# Patient Record
Sex: Male | Born: 1935 | Race: Black or African American | Hispanic: No | Smoking: Never smoker
Health system: Southern US, Community
[De-identification: ages and names within clinical notes are randomized; demographics above are authoritative.]

## PROBLEM LIST (undated history)

## (undated) DIAGNOSIS — I255 Ischemic cardiomyopathy: Secondary | ICD-10-CM

## (undated) DIAGNOSIS — E785 Hyperlipidemia, unspecified: Secondary | ICD-10-CM

## (undated) DIAGNOSIS — I2510 Atherosclerotic heart disease of native coronary artery without angina pectoris: Secondary | ICD-10-CM

## (undated) DIAGNOSIS — I10 Essential (primary) hypertension: Secondary | ICD-10-CM

## (undated) DIAGNOSIS — N183 Chronic kidney disease, stage 3 unspecified: Secondary | ICD-10-CM

## (undated) HISTORY — DX: Hyperlipidemia, unspecified: E78.5

## (undated) HISTORY — PX: PROSTATE BIOPSY: SHX241

## (undated) HISTORY — DX: Ischemic cardiomyopathy: I25.5

## (undated) HISTORY — PX: INGUINAL HERNIA REPAIR: SUR1180

## (undated) HISTORY — DX: Chronic kidney disease, stage 3 unspecified: N18.30

## (undated) HISTORY — DX: Essential (primary) hypertension: I10

## (undated) HISTORY — DX: Atherosclerotic heart disease of native coronary artery without angina pectoris: I25.10

---

## 2009-12-20 ENCOUNTER — Ambulatory Visit: Payer: Self-pay | Admitting: Cardiology

## 2009-12-20 ENCOUNTER — Inpatient Hospital Stay (HOSPITAL_COMMUNITY): Admission: EM | Admit: 2009-12-20 | Discharge: 2009-12-25 | Payer: Self-pay | Admitting: Emergency Medicine

## 2009-12-21 ENCOUNTER — Encounter (INDEPENDENT_AMBULATORY_CARE_PROVIDER_SITE_OTHER): Payer: Self-pay | Admitting: Internal Medicine

## 2009-12-21 ENCOUNTER — Other Ambulatory Visit: Payer: Self-pay | Admitting: Urology

## 2009-12-21 ENCOUNTER — Other Ambulatory Visit: Payer: Self-pay | Admitting: Internal Medicine

## 2009-12-22 ENCOUNTER — Other Ambulatory Visit: Payer: Self-pay | Admitting: Urology

## 2009-12-22 ENCOUNTER — Other Ambulatory Visit: Payer: Self-pay | Admitting: Internal Medicine

## 2009-12-23 ENCOUNTER — Other Ambulatory Visit: Payer: Self-pay | Admitting: Internal Medicine

## 2009-12-23 ENCOUNTER — Other Ambulatory Visit: Payer: Self-pay | Admitting: Urology

## 2009-12-24 ENCOUNTER — Other Ambulatory Visit: Payer: Self-pay | Admitting: Family Medicine

## 2009-12-24 ENCOUNTER — Other Ambulatory Visit: Payer: Self-pay | Admitting: Internal Medicine

## 2009-12-25 ENCOUNTER — Other Ambulatory Visit: Payer: Self-pay | Admitting: Internal Medicine

## 2009-12-25 ENCOUNTER — Other Ambulatory Visit: Payer: Self-pay | Admitting: Family Medicine

## 2009-12-27 DIAGNOSIS — N400 Enlarged prostate without lower urinary tract symptoms: Secondary | ICD-10-CM | POA: Insufficient documentation

## 2009-12-27 DIAGNOSIS — E1151 Type 2 diabetes mellitus with diabetic peripheral angiopathy without gangrene: Secondary | ICD-10-CM | POA: Insufficient documentation

## 2009-12-27 DIAGNOSIS — I152 Hypertension secondary to endocrine disorders: Secondary | ICD-10-CM | POA: Insufficient documentation

## 2009-12-27 DIAGNOSIS — Z87898 Personal history of other specified conditions: Secondary | ICD-10-CM

## 2009-12-27 DIAGNOSIS — E119 Type 2 diabetes mellitus without complications: Secondary | ICD-10-CM | POA: Insufficient documentation

## 2009-12-27 DIAGNOSIS — D649 Anemia, unspecified: Secondary | ICD-10-CM | POA: Insufficient documentation

## 2009-12-27 DIAGNOSIS — I10 Essential (primary) hypertension: Secondary | ICD-10-CM

## 2009-12-27 DIAGNOSIS — D539 Nutritional anemia, unspecified: Secondary | ICD-10-CM

## 2009-12-29 ENCOUNTER — Inpatient Hospital Stay (HOSPITAL_COMMUNITY): Admission: EM | Admit: 2009-12-29 | Discharge: 2009-12-31 | Payer: Self-pay | Admitting: Emergency Medicine

## 2009-12-29 ENCOUNTER — Ambulatory Visit: Payer: Self-pay | Admitting: Internal Medicine

## 2009-12-30 ENCOUNTER — Encounter: Payer: Self-pay | Admitting: Internal Medicine

## 2009-12-31 ENCOUNTER — Encounter: Payer: Self-pay | Admitting: Internal Medicine

## 2010-01-03 ENCOUNTER — Telehealth: Payer: Self-pay | Admitting: Internal Medicine

## 2010-01-04 ENCOUNTER — Ambulatory Visit: Payer: Self-pay | Admitting: Internal Medicine

## 2010-01-15 ENCOUNTER — Encounter: Payer: Self-pay | Admitting: Family Medicine

## 2010-01-15 ENCOUNTER — Encounter: Payer: Self-pay | Admitting: Internal Medicine

## 2010-01-15 DIAGNOSIS — R0989 Other specified symptoms and signs involving the circulatory and respiratory systems: Secondary | ICD-10-CM | POA: Insufficient documentation

## 2010-01-15 DIAGNOSIS — R079 Chest pain, unspecified: Secondary | ICD-10-CM | POA: Insufficient documentation

## 2010-01-16 ENCOUNTER — Encounter: Payer: Self-pay | Admitting: Family Medicine

## 2010-01-16 LAB — CONVERTED CEMR LAB
Albumin: 3.8 g/dL (ref 3.5–5.2)
BUN: 26 mg/dL — ABNORMAL HIGH (ref 6–23)
CO2: 21 meq/L (ref 19–32)
Calcium: 9.5 mg/dL (ref 8.4–10.5)
Creatinine, Ser: 1.87 mg/dL — ABNORMAL HIGH (ref 0.40–1.50)
Ferritin: 337 ng/mL — ABNORMAL HIGH (ref 22–322)
Glucose, Bld: 95 mg/dL (ref 70–99)
MCHC: 32.6 g/dL (ref 30.0–36.0)
Platelets: 246 10*3/uL (ref 150–400)
Potassium: 5 meq/L (ref 3.5–5.3)
RBC: 3.5 M/uL — ABNORMAL LOW (ref 4.22–5.81)
RDW: 17.4 % — ABNORMAL HIGH (ref 11.5–15.5)
Sodium: 136 meq/L (ref 135–145)
WBC: 5.1 10*3/uL (ref 4.0–10.5)

## 2010-01-17 ENCOUNTER — Ambulatory Visit: Payer: Self-pay | Admitting: Internal Medicine

## 2010-01-17 DIAGNOSIS — I951 Orthostatic hypotension: Secondary | ICD-10-CM | POA: Insufficient documentation

## 2010-01-29 ENCOUNTER — Encounter: Payer: Self-pay | Admitting: Internal Medicine

## 2010-02-20 ENCOUNTER — Ambulatory Visit: Payer: Self-pay | Admitting: Internal Medicine

## 2010-02-26 ENCOUNTER — Encounter: Payer: Self-pay | Admitting: Internal Medicine

## 2010-02-28 LAB — CONVERTED CEMR LAB
Calcium: 9.2 mg/dL (ref 8.4–10.5)
Glucose, Bld: 93 mg/dL (ref 70–99)
Sodium: 138 meq/L (ref 135–145)

## 2010-11-05 NOTE — Procedures (Signed)
Summary: Holter and Event  Holter and Event   Imported By: Erle Crocker 03/01/2010 09:03:36  _____________________________________________________________________  External Attachment:    Type:   Image     Comment:   External Document

## 2010-11-05 NOTE — Progress Notes (Signed)
Summary: needs a friday if possible  Phone Note Call from Patient Call back at [phone]    Caller: pt Reason for Call: Talk to Doctor Summary of Call: pt is the father of dr simpson and she is calling to see if we could possible move him to a friday after 12:00 she doesnt work on [employer] after noon. I offer a appt in may but she needs him seen in the next couple weeks. Initial call taken by: Faythe Ghee,  January 03, 2010 12:28 PM  Follow-up for Phone Call        Fri 5/6 or they can keep appt already sch on 4/14 Meredith Staggers, RN  January 11, 2010 1:27 PM   pt will keep appt 4/14 Meredith Staggers, RN  January 14, 2010 9:30 AM

## 2010-11-05 NOTE — Assessment & Plan Note (Signed)
Summary: EPH/APPT AT 1:30   Visit Type:  Initial Consult Primary Provider:  Dr Mirna Mires  CC:  some lightheaded, low BP, and palpatations.  History of Present Illness: Roy Ibarra is a delightful 77 y/o male (father of Roy Ibarra) with a h/o HTN, hyperlipidemia and recently-diagnosed diabetes. Presents for post-hospital f/u.   Recently underwent prostate biopsy and was comlicated by prostate abscess. During this time developed CP, dyspnea and palpitations. EF 55-65% mild LVH RV normal. No vegetations. Myoview with EF 62% and no ischemia or scar. Has beend wearing event monitor.   No CP, SOB or palpitations. Main problem is orthostasis. Daughter stopped the avodart and is holding Diovan. Perhaps mildly better but still having problems. No syncope. No edema. Po intake ok. However Cr up from 1.1 to 1.8 recently. No dysuria. No fevers and chills.   Current Medications (verified): 1)  Diovan 80 Mg Tabs (Valsartan) .... Take 1 Tab Daily -- Hold 2)  Metformin Hcl 500 Mg Tabs (Metformin Hcl) .... Take 1 Tab Daily -- Hold 3)  Ferrous Sulfate 325 (65 Fe) Mg Tabs (Ferrous Sulfate) .... Take 1 Tab Daily 4)  Calcium 600 1500 Mg Tabs (Calcium Carbonate) .... Take 1 Tab Daily 5)  Daily Multi  Tabs (Multiple Vitamins-Minerals) .... Take 1 Tab Daily 6)  Septra Ds 800-160 Mg Tabs (Sulfamethoxazole-Trimethoprim) .... Two Times A Day 7)  Cosopt 22.3-6.8 Mg/ml Soln (Dorzolamide Hcl-Timolol Mal) .... Use Daily 8)  Hyoscyamine Sulfate 0.125 Mg Tabs (Hyoscyamine Sulfate) .... Every 6 Hrs As Needed  Allergies: No Known Drug Allergies  Past History:  Past Medical History: Last updated: 01/15/2010  1. Chest pain.   2. Anemia.   3. Type 2 diabetes.   4. Prostate abscess currently on Bactrim.   5. Generalized weakness.   6. Hypertension.   7. Benign prostatic hypertrophy.   8. Thrombocytosis.   9. Tachycardia.  Family History: Family History of Cancer:  Family History of Diabetes:    Social History: Retired  Married -- daughter is a family Dr in [employer] Tobacco Use - No.  Alcohol Use - no Regular Exercise - no Drug Use - no  Vital Signs:  Patient profile:   77 year old male Height:      71 inches Weight:      174 pounds O2 Sat:      97 % on Room air Pulse rate:   68 / minute Pulse (ortho):   90 / minute BP standing:   98 / 62  Vitals Entered By: Hardin Negus, RMA (January 17, 2010 3:09 PM)  O2 Flow:  Room air  Serial Vital Signs/Assessments:  Time      Position  BP       Pulse  Resp  Temp     By           Lying LA  138/70   65                    Hardin Negus, RMA           Sitting   112/70   [address], [place]           Standing  98/62    90                    Hardin Negus, RMA  Comments: 2 mins -  BP 112/70  P 89 4 mins - BP 122/74  P 85 very lightheaded when he stood up then resolved fairly quick By: Hardin Negus, RMA    Physical Exam  General:  Gen: thin well appearing. no resp difficulty HEENT: normal Neck: supple. no JVD. Carotids 2+ bilat; no bruits.  Cor: PMI nondisplaced. Regular rate & rhythm. No rubs murmur. +s4 Lungs: clear Abdomen: soft, nontender, nondistended. No hepatosplenomegaly. No bruits or masses. Good bowel sounds. Extremities: no cyanosis, clubbing, rash, edema Neuro: alert & orientedx3, cranial nerves grossly intact. moves all 4 extremities w/o difficulty. affect pleasant    Impression & Recommendations:  Problem # 1:  ORTHOSTATIC HYPOTENSION (ICD-458.0) Improving slowly. Hold all BP meds. Increase fluid and salt intake. Support stockings. If not improving can consider Florinef. PCP will recheck BMET if creatinine continue to increase will need renal u/s to r/o obstruction.  Problem # 2:  TACHYCARDIA (ICD-785) Resolved. Monitor looks good so far. ECHO and stress test normal.   Other Orders: EKG w/ Interpretation (93000)  Patient Instructions: 1)  Follow up in 1  month  Prevention & Chronic Care Immunizations   Influenza vaccine: Not documented    Tetanus booster: Not documented    Pneumococcal vaccine: Not documented    H. zoster vaccine: Not documented  Colorectal Screening   Hemoccult: Not documented    Colonoscopy: Not documented  Other Screening   PSA: Not documented   Smoking status: never  (12/27/2009)  Diabetes Mellitus   HgbA1C: 7.6  (01/15/2010)    Eye exam: Not documented    Foot exam: Not documented   High risk foot: Not documented   Foot care education: Not documented    Urine microalbumin/creatinine ratio: Not documented  Lipids   Total Cholesterol: Not documented   LDL: Not documented   LDL Direct: Not documented   HDL: Not documented   Triglycerides: Not documented  Hypertension   Last Blood Pressure: Not Documented   Serum creatinine: 1.87  (01/15/2010)   Serum potassium 5.0  (01/15/2010)  Self-Management Support :    Diabetes self-management support: Not documented    Hypertension self-management support: Not documented

## 2010-11-05 NOTE — Assessment & Plan Note (Signed)
Summary: per check out/sf   Visit Type:  Follow-up Primary Provider:  Dr Mirna Mires  CC:  no complaints.  History of Present Illness: Roy Ibarra is a delightful 77 y/o male (father of Dr. Syliva Ibarra) with a h/o HTN, hyperlipidemia and recently-diagnosed diabetes. Presents for post-hospital f/u.   Recently underwent prostate biopsy and was comlicated by prostate abscess. During this time developed CP, dyspnea and palpitations. EF 55-65% mild LVH RV normal. No vegetations. Myoview with EF 62% and no ischemia or scar. Monitor with SR with occ PACs.  No CP, SOB or palpitations. Trace edema Walking regularly. BP up a bit. Previosuly on Diovan 160 but cut back.   Current Medications (verified): 1)  Diovan 80 Mg Tabs (Valsartan) .... Take 1 Tab Daily 2)  Ferrous Sulfate 325 (65 Fe) Mg Tabs (Ferrous Sulfate) .... Take 1 Tab Daily 3)  Calcium 600 1500 Mg Tabs (Calcium Carbonate) .... Take 1 Tab Daily 4)  Daily Multi  Tabs (Multiple Vitamins-Minerals) .... Take 1 Tab Daily 5)  Cosopt 22.3-6.8 Mg/ml Soln (Dorzolamide Hcl-Timolol Mal) .... Use Daily  Allergies (verified): No Known Drug Allergies  Past History:  Past Medical History: Last updated: 01/15/2010  1. Chest pain.   2. Anemia.   3. Type 2 diabetes.   4. Prostate abscess currently on Bactrim.   5. Generalized weakness.   6. Hypertension.   7. Benign prostatic hypertrophy.   8. Thrombocytosis.   9. Tachycardia.  Review of Systems       As per HPI and past medical history; otherwise all systems negative.   Vital Signs:  Patient profile:   77 year old male Height:      71 inches Weight:      174 pounds BMI:     24.36 Pulse rate:   65 / minute BP sitting:   148 / 92  (left arm) Cuff size:   regular  Vitals Entered By: Hardin Negus, RMA (Feb 20, 2010 3:21 PM)  Physical Exam  General:  Gen: thin well appearing. no resp difficulty HEENT: normal Neck: supple. no JVD. Carotids 2+ bilat; no bruits.  Cor: PMI  nondisplaced. Regular rate & rhythm. No rubs murmur. +s4 Lungs: clear Abdomen: soft, nontender, nondistended. No hepatosplenomegaly. No bruits or masses. Good bowel sounds. Extremities: no cyanosis, clubbing, rash. tr edema Neuro: alert & orientedx3, cranial nerves grossly intact. moves all 4 extremities w/o difficulty. affect pleasant    Impression & Recommendations:  Problem # 1:  HYPERTENSION (ICD-401.9) Orthostasis resolved. BP mildly elevated. Will increase Diovan to 160. Recheck BMET and BNP in 1 week prior to his return to [place].   Patient Instructions: 1)  Increase Diovan to 160mg  daily 2)  Labs with PCP 3)  Follow up as needed

## 2010-12-30 LAB — GLUCOSE, CAPILLARY
Glucose-Capillary: 103 mg/dL — ABNORMAL HIGH (ref 70–99)
Glucose-Capillary: 117 mg/dL — ABNORMAL HIGH (ref 70–99)
Glucose-Capillary: 122 mg/dL — ABNORMAL HIGH (ref 70–99)
Glucose-Capillary: 125 mg/dL — ABNORMAL HIGH (ref 70–99)
Glucose-Capillary: 132 mg/dL — ABNORMAL HIGH (ref 70–99)
Glucose-Capillary: 139 mg/dL — ABNORMAL HIGH (ref 70–99)
Glucose-Capillary: 148 mg/dL — ABNORMAL HIGH (ref 70–99)
Glucose-Capillary: 155 mg/dL — ABNORMAL HIGH (ref 70–99)
Glucose-Capillary: 158 mg/dL — ABNORMAL HIGH (ref 70–99)
Glucose-Capillary: 164 mg/dL — ABNORMAL HIGH (ref 70–99)
Glucose-Capillary: 170 mg/dL — ABNORMAL HIGH (ref 70–99)
Glucose-Capillary: 188 mg/dL — ABNORMAL HIGH (ref 70–99)
Glucose-Capillary: 206 mg/dL — ABNORMAL HIGH (ref 70–99)
Glucose-Capillary: 86 mg/dL (ref 70–99)
Glucose-Capillary: 98 mg/dL (ref 70–99)

## 2010-12-30 LAB — CBC
HCT: 26 % — ABNORMAL LOW (ref 39.0–52.0)
HCT: 28.1 % — ABNORMAL LOW (ref 39.0–52.0)
HCT: 29.2 % — ABNORMAL LOW (ref 39.0–52.0)
HCT: 31.2 % — ABNORMAL LOW (ref 39.0–52.0)
HCT: 31.4 % — ABNORMAL LOW (ref 39.0–52.0)
Hemoglobin: 10.4 g/dL — ABNORMAL LOW (ref 13.0–17.0)
Hemoglobin: 10.9 g/dL — ABNORMAL LOW (ref 13.0–17.0)
Hemoglobin: 8.7 g/dL — ABNORMAL LOW (ref 13.0–17.0)
Hemoglobin: 8.7 g/dL — ABNORMAL LOW (ref 13.0–17.0)
Hemoglobin: 9.2 g/dL — ABNORMAL LOW (ref 13.0–17.0)
Hemoglobin: 9.9 g/dL — ABNORMAL LOW (ref 13.0–17.0)
MCHC: 32.6 g/dL (ref 30.0–36.0)
MCHC: 33.4 g/dL (ref 30.0–36.0)
MCHC: 33.5 g/dL (ref 30.0–36.0)
MCHC: 33.6 g/dL (ref 30.0–36.0)
MCV: 83.8 fL (ref 78.0–100.0)
MCV: 84.7 fL (ref 78.0–100.0)
MCV: 84.7 fL (ref 78.0–100.0)
MCV: 85 fL (ref 78.0–100.0)
MCV: 85.1 fL (ref 78.0–100.0)
MCV: 85.5 fL (ref 78.0–100.0)
Platelets: 402 10*3/uL — ABNORMAL HIGH (ref 150–400)
Platelets: 416 10*3/uL — ABNORMAL HIGH (ref 150–400)
Platelets: 447 10*3/uL — ABNORMAL HIGH (ref 150–400)
Platelets: 475 10*3/uL — ABNORMAL HIGH (ref 150–400)
RBC: 3.12 MIL/uL — ABNORMAL LOW (ref 4.22–5.81)
RBC: 3.26 MIL/uL — ABNORMAL LOW (ref 4.22–5.81)
RBC: 3.31 MIL/uL — ABNORMAL LOW (ref 4.22–5.81)
RBC: 3.5 MIL/uL — ABNORMAL LOW (ref 4.22–5.81)
RBC: 3.68 MIL/uL — ABNORMAL LOW (ref 4.22–5.81)
RBC: 3.74 MIL/uL — ABNORMAL LOW (ref 4.22–5.81)
RDW: 13.8 % (ref 11.5–15.5)
RDW: 14.1 % (ref 11.5–15.5)
RDW: 14.3 % (ref 11.5–15.5)
RDW: 14.5 % (ref 11.5–15.5)
RDW: 14.9 % (ref 11.5–15.5)
RDW: 14.9 % (ref 11.5–15.5)
WBC: 21.6 10*3/uL — ABNORMAL HIGH (ref 4.0–10.5)
WBC: 21.9 10*3/uL — ABNORMAL HIGH (ref 4.0–10.5)
WBC: 6.4 10*3/uL (ref 4.0–10.5)

## 2010-12-30 LAB — BASIC METABOLIC PANEL
BUN: 11 mg/dL (ref 6–23)
BUN: 13 mg/dL (ref 6–23)
BUN: 14 mg/dL (ref 6–23)
CO2: 24 mEq/L (ref 19–32)
CO2: 25 mEq/L (ref 19–32)
CO2: 26 mEq/L (ref 19–32)
Calcium: 8.1 mg/dL — ABNORMAL LOW (ref 8.4–10.5)
Calcium: 8.7 mg/dL (ref 8.4–10.5)
Calcium: 8.9 mg/dL (ref 8.4–10.5)
Chloride: 103 mEq/L (ref 96–112)
Chloride: 105 mEq/L (ref 96–112)
Chloride: 105 mEq/L (ref 96–112)
Creatinine, Ser: 1.13 mg/dL (ref 0.4–1.5)
Creatinine, Ser: 1.13 mg/dL (ref 0.4–1.5)
Creatinine, Ser: 1.14 mg/dL (ref 0.4–1.5)
GFR calc Af Amer: >60 mL/min (ref >60)
GFR calc non Af Amer: >60 mL/min (ref >60)
GFR calc non Af Amer: >60 mL/min (ref >60)
Glucose, Bld: 110 mg/dL — ABNORMAL HIGH (ref 70–99)
Glucose, Bld: 127 mg/dL — ABNORMAL HIGH (ref 70–99)
Glucose, Bld: 136 mg/dL — ABNORMAL HIGH (ref 70–99)
Glucose, Bld: 98 mg/dL (ref 70–99)
Potassium: 4.1 mEq/L (ref 3.5–5.1)
Sodium: 129 mEq/L — ABNORMAL LOW (ref 135–145)
Sodium: 134 mEq/L — ABNORMAL LOW (ref 135–145)
Sodium: 137 mEq/L (ref 135–145)

## 2010-12-30 LAB — COMPREHENSIVE METABOLIC PANEL
ALT: 25 U/L (ref 0–53)
ALT: 31 U/L (ref 0–53)
AST: 25 U/L (ref 0–37)
Albumin: 2.5 g/dL — ABNORMAL LOW (ref 3.5–5.2)
Alkaline Phosphatase: 86 U/L (ref 39–117)
BUN: 10 mg/dL (ref 6–23)
CO2: 22 mEq/L (ref 19–32)
CO2: 23 mEq/L (ref 19–32)
CO2: 25 mEq/L (ref 19–32)
Calcium: 8.1 mg/dL — ABNORMAL LOW (ref 8.4–10.5)
Calcium: 8.9 mg/dL (ref 8.4–10.5)
Chloride: 104 mEq/L (ref 96–112)
Chloride: 98 mEq/L (ref 96–112)
Creatinine, Ser: 0.88 mg/dL (ref 0.4–1.5)
Creatinine, Ser: 1.81 mg/dL — ABNORMAL HIGH (ref 0.4–1.5)
GFR calc Af Amer: >60 mL/min (ref >60)
GFR calc Af Amer: >60 mL/min (ref >60)
GFR calc non Af Amer: 36 mL/min — ABNORMAL LOW (ref >60)
GFR calc non Af Amer: 52 mL/min — ABNORMAL LOW (ref >60)
GFR calc non Af Amer: >60 mL/min (ref >60)
Glucose, Bld: 136 mg/dL — ABNORMAL HIGH (ref 70–99)
Glucose, Bld: 143 mg/dL — ABNORMAL HIGH (ref 70–99)
Sodium: 131 mEq/L — ABNORMAL LOW (ref 135–145)
Total Bilirubin: 0.4 mg/dL (ref 0.3–1.2)
Total Bilirubin: 0.6 mg/dL (ref 0.3–1.2)
Total Protein: 8.7 g/dL — ABNORMAL HIGH (ref 6.0–8.3)

## 2010-12-30 LAB — CULTURE, BLOOD (ROUTINE X 2)
Culture: NO GROWTH
Culture: NO GROWTH

## 2010-12-30 LAB — DIFFERENTIAL
Basophils Absolute: 0 10*3/uL (ref 0.0–0.1)
Basophils Absolute: 0 10*3/uL (ref 0.0–0.1)
Basophils Absolute: 0 10*3/uL (ref 0.0–0.1)
Eosinophils Absolute: 0 10*3/uL (ref 0.0–0.7)
Eosinophils Absolute: 0.1 10*3/uL (ref 0.0–0.7)
Eosinophils Absolute: 0.2 10*3/uL (ref 0.0–0.7)
Eosinophils Relative: 0 % (ref 0–5)
Eosinophils Relative: 2 % (ref 0–5)
Eosinophils Relative: 3 % (ref 0–5)
Lymphocytes Relative: 31 % (ref 12–46)
Lymphocytes Relative: 4 % — ABNORMAL LOW (ref 12–46)
Lymphs Abs: 1.4 10*3/uL (ref 0.7–4.0)
Lymphs Abs: 2 10*3/uL (ref 0.7–4.0)
Lymphs Abs: 4.8 10*3/uL — ABNORMAL HIGH (ref 0.7–4.0)
Monocytes Absolute: 1.9 10*3/uL — ABNORMAL HIGH (ref 0.1–1.0)
Monocytes Absolute: 3.5 10*3/uL — ABNORMAL HIGH (ref 0.1–1.0)
Monocytes Relative: 13 % — ABNORMAL HIGH (ref 3–12)
Monocytes Relative: 14 % — ABNORMAL HIGH (ref 3–12)
Monocytes Relative: 9 % (ref 3–12)
Myelocytes: 0 %
Neutro Abs: 18.9 10*3/uL — ABNORMAL HIGH (ref 1.7–7.7)
nRBC: 0 /100 WBC

## 2010-12-30 LAB — CK TOTAL AND CKMB (NOT AT ARMC)
CK, MB: 3 ng/mL (ref 0.3–4.0)
Relative Index: INVALID (ref 0.0–2.5)
Total CK: 57 U/L (ref 7–232)

## 2010-12-30 LAB — URINALYSIS, ROUTINE W REFLEX MICROSCOPIC
Bilirubin Urine: NEGATIVE
Glucose, UA: NEGATIVE mg/dL
Hgb urine dipstick: NEGATIVE
Ketones, ur: NEGATIVE mg/dL
Nitrite: NEGATIVE
Protein, ur: NEGATIVE mg/dL
Specific Gravity, Urine: 1.021 (ref 1.005–1.030)
Urobilinogen, UA: 1 mg/dL (ref 0.0–1.0)

## 2010-12-30 LAB — CARDIAC PANEL(CRET KIN+CKTOT+MB+TROPI)
Relative Index: INVALID (ref 0.0–2.5)
Relative Index: INVALID (ref 0.0–2.5)
Relative Index: INVALID (ref 0.0–2.5)
Relative Index: INVALID (ref 0.0–2.5)
Total CK: 51 U/L (ref 7–232)
Total CK: 81 U/L (ref 7–232)
Troponin I: 0.02 ng/mL (ref 0.00–0.06)
Troponin I: 0.04 ng/mL (ref 0.00–0.06)
Troponin I: 0.04 ng/mL (ref 0.00–0.06)
Troponin I: 0.05 ng/mL (ref 0.00–0.06)
Troponin I: 0.06 ng/mL (ref 0.00–0.06)

## 2010-12-30 LAB — RETICULOCYTES
RBC.: 3.75 MIL/uL — ABNORMAL LOW (ref 4.22–5.81)
Retic Count, Absolute: 37.5 10*3/uL (ref 19.0–186.0)

## 2010-12-30 LAB — D-DIMER, QUANTITATIVE: D-Dimer, Quant: 1.62 ug/mL-FEU — ABNORMAL HIGH (ref 0.00–0.48)

## 2010-12-30 LAB — TSH: TSH: 1.508 u[IU]/mL (ref 0.350–4.500)

## 2010-12-30 LAB — URINE MICROSCOPIC-ADD ON

## 2010-12-30 LAB — POCT I-STAT, CHEM 8
BUN: 20 mg/dL (ref 6–23)
Creatinine, Ser: 1.4 mg/dL (ref 0.4–1.5)
Glucose, Bld: 157 mg/dL — ABNORMAL HIGH (ref 70–99)
Hemoglobin: 10.5 g/dL — ABNORMAL LOW (ref 13.0–17.0)
Potassium: 4.5 mEq/L (ref 3.5–5.1)

## 2010-12-30 LAB — MRSA CULTURE

## 2010-12-30 LAB — URINE CULTURE
Culture: NO GROWTH
Special Requests: POSITIVE

## 2010-12-30 LAB — TROPONIN I: Troponin I: 0.07 ng/mL — ABNORMAL HIGH (ref 0.00–0.06)

## 2010-12-30 LAB — T4: T4, Total: 5.9 ug/dL (ref 5.0–12.5)

## 2010-12-30 LAB — TYPE AND SCREEN: ABO/RH(D): A NEG

## 2010-12-30 LAB — ABO/RH: ABO/RH(D): A NEG

## 2010-12-30 LAB — VITAMIN B12: Vitamin B-12: 780 pg/mL (ref 211–911)

## 2010-12-30 LAB — POCT CARDIAC MARKERS: CKMB, poc: 2.3 ng/mL (ref 1.0–8.0)

## 2010-12-30 LAB — IRON AND TIBC
TIBC: 155 ug/dL — ABNORMAL LOW (ref 215–435)
UIBC: 136 ug/dL

## 2010-12-30 LAB — HEMOCCULT GUIAC POC 1CARD (OFFICE): Fecal Occult Bld: NEGATIVE

## 2010-12-30 LAB — OSMOLALITY: Osmolality: 273 mOsm/kg — ABNORMAL LOW (ref 275–300)

## 2010-12-30 LAB — OSMOLALITY, URINE: Osmolality, Ur: 544 mOsm/kg (ref 390–1090)

## 2010-12-30 LAB — FERRITIN: Ferritin: 460 ng/mL — ABNORMAL HIGH (ref 22–322)

## 2011-12-23 ENCOUNTER — Other Ambulatory Visit: Payer: Self-pay | Admitting: Ophthalmology

## 2011-12-23 DIAGNOSIS — H2510 Age-related nuclear cataract, unspecified eye: Secondary | ICD-10-CM

## 2011-12-23 IMAGING — CR DG CHEST 1V PORT
1 series · 1 of 1 positions shown · non-contrast
Comparison: 12/20/2009

CLINICAL DATA: Fast heart rate.  Chest pain.

PORTABLE CHEST - 1 VIEW

[view not recorded]
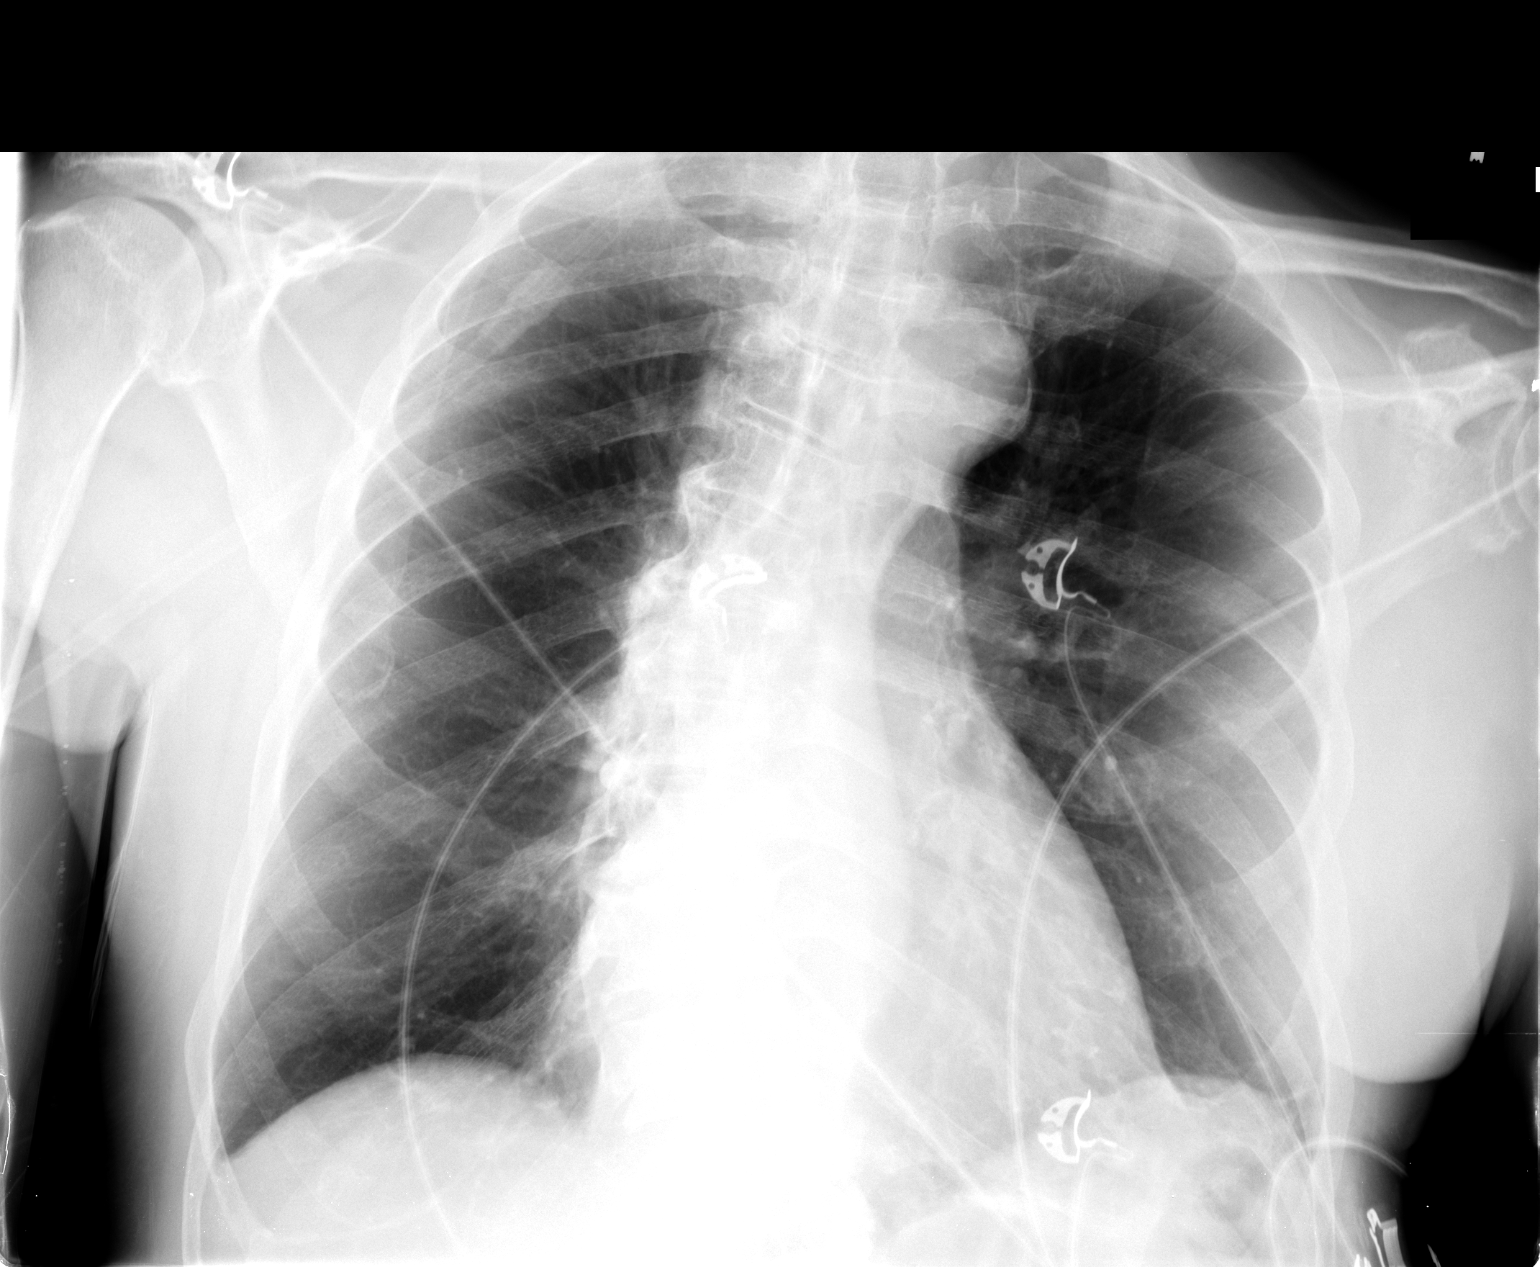

[1 of 1 positions shown; findings below may reference images not displayed]

FINDINGS: Heart size is normal.

No pleural effusion or pulmonary edema.

No airspace consolidation identified.
IMPRESSION: 1.  No active cardiopulmonary disease.

## 2011-12-31 ENCOUNTER — Encounter (HOSPITAL_COMMUNITY): Admission: RE | Payer: Self-pay | Source: Ambulatory Visit

## 2011-12-31 ENCOUNTER — Ambulatory Visit (HOSPITAL_COMMUNITY): Admission: RE | Admit: 2011-12-31 | Payer: Self-pay | Source: Ambulatory Visit | Admitting: Ophthalmology

## 2011-12-31 SURGERY — PHACOEMULSIFICATION, CATARACT, WITH IOL INSERTION
Anesthesia: Monitor Anesthesia Care | Laterality: Left

## 2016-08-15 ENCOUNTER — Ambulatory Visit (INDEPENDENT_AMBULATORY_CARE_PROVIDER_SITE_OTHER): Payer: Self-pay | Admitting: Family Medicine

## 2016-08-15 ENCOUNTER — Encounter: Payer: Self-pay | Admitting: Family Medicine

## 2016-08-15 VITALS — BP 136/94 | Ht 71.0 in | Wt 179.0 lb

## 2016-08-15 DIAGNOSIS — I10 Essential (primary) hypertension: Secondary | ICD-10-CM

## 2016-08-15 MED ORDER — VALSARTAN 80 MG PO TABS: 80.0000 mg | ORAL_TABLET | Freq: Every day | ORAL | 1 refills | Status: DC

## 2016-08-15 NOTE — Progress Notes (Signed)
   Subjective:    Patient ID: Roy Ibarra, male    DOB: 09/27/1923, 82 y.o.   MRN: [id number]  HPI Patient in today for consult as new patient.  Only on bp meds, geernally good control on the   Vit d supplement  Takes regulaly   bp meds good   Eat well and goo  flomas did not help much    ytwo or three yrs ago  Developed sepsis from prostate   Was on flomax for urinating, but light headed caused troubles  [facility]   [facility]   Patient has concerns of frequent urination at night.    Review of Systems No headache, no major weight loss or weight gain, no chest pain no back pain abdominal pain no change in bowel habits complete ROS otherwise negative     Objective:   Physical Exam  Alert vitals stable, NAD. Blood pressure good on repeat. HEENT normal. Lungs clear. Heart regular rate and rhythm.       Assessment & Plan:  Impression hypertension good control discussed maintain same #2 history of prostate abscess with subsequent sepsis which nearly tick patient's life #3 probable prostate hypertrophy with minimal responsive Flomax plan diet exercise discussed. Maintain blood pressure medication. In 6 months will consider physical plus blood work patient is said blood work a few months ago [place]

## 2017-05-11 ENCOUNTER — Other Ambulatory Visit: Payer: Self-pay | Admitting: Neurology

## 2017-05-11 DIAGNOSIS — G459 Transient cerebral ischemic attack, unspecified: Secondary | ICD-10-CM

## 2017-05-12 ENCOUNTER — Ambulatory Visit (HOSPITAL_COMMUNITY)
Admission: RE | Admit: 2017-05-12 | Discharge: 2017-05-12 | Disposition: A | Discharge status: Home / Self Care | Payer: Self-pay | Source: Ambulatory Visit | Attending: Neurology | Admitting: Neurology

## 2017-05-12 DIAGNOSIS — G459 Transient cerebral ischemic attack, unspecified: Secondary | ICD-10-CM | POA: Insufficient documentation

## 2017-05-25 ENCOUNTER — Other Ambulatory Visit: Payer: Self-pay | Admitting: Neurology

## 2017-05-25 DIAGNOSIS — R531 Weakness: Secondary | ICD-10-CM

## 2017-05-25 DIAGNOSIS — R471 Dysarthria and anarthria: Secondary | ICD-10-CM

## 2017-06-01 ENCOUNTER — Ambulatory Visit (HOSPITAL_COMMUNITY)
Admission: RE | Admit: 2017-06-01 | Discharge: 2017-06-01 | Disposition: A | Discharge status: Home / Self Care | Payer: Self-pay | Source: Ambulatory Visit | Attending: Neurology | Admitting: Neurology

## 2017-06-01 DIAGNOSIS — R471 Dysarthria and anarthria: Secondary | ICD-10-CM | POA: Insufficient documentation

## 2017-06-01 DIAGNOSIS — R531 Weakness: Secondary | ICD-10-CM | POA: Insufficient documentation

## 2017-06-01 DIAGNOSIS — J322 Chronic ethmoidal sinusitis: Secondary | ICD-10-CM | POA: Insufficient documentation

## 2017-11-06 HISTORY — PX: STENT PLACEMENT VASCULAR (ARMC HX): HXRAD1737

## 2018-08-20 ENCOUNTER — Ambulatory Visit (INDEPENDENT_AMBULATORY_CARE_PROVIDER_SITE_OTHER): Payer: Self-pay | Admitting: Cardiovascular Disease

## 2018-08-20 ENCOUNTER — Encounter: Payer: Self-pay | Admitting: Cardiovascular Disease

## 2018-08-20 VITALS — BP 164/78 | HR 69 | Ht 71.0 in | Wt 181.0 lb

## 2018-08-20 DIAGNOSIS — Z955 Presence of coronary angioplasty implant and graft: Secondary | ICD-10-CM

## 2018-08-20 DIAGNOSIS — E785 Hyperlipidemia, unspecified: Secondary | ICD-10-CM

## 2018-08-20 DIAGNOSIS — I10 Essential (primary) hypertension: Secondary | ICD-10-CM

## 2018-08-20 DIAGNOSIS — I25118 Atherosclerotic heart disease of native coronary artery with other forms of angina pectoris: Secondary | ICD-10-CM

## 2018-08-20 MED ORDER — VALSARTAN 80 MG PO TABS: 80.0000 mg | ORAL_TABLET | Freq: Every day | ORAL | 3 refills | Status: DC

## 2018-08-20 MED ORDER — ATORVASTATIN CALCIUM 40 MG PO TABS: 40.0000 mg | ORAL_TABLET | Freq: Every day | ORAL | 3 refills | Status: DC

## 2018-08-20 NOTE — Progress Notes (Addendum)
CARDIOLOGY CONSULT NOTE  Patient ID: Roy Ibarra MRN: [id number] DOB/AGE: [birth year] 82 y.o.  Admit date: (Not on file) Primary Physician: Merlyn Albert, MD Referring Physician: Dr. [name]  Reason for Consultation: Coronary artery disease  HPI: Roy Ibarra is a 84 y.o. male who is being seen today for the evaluation of coronary artery disease at the request of Dr. [name].  He underwent a normal nuclear stress test on 12/30/2009, LVEF 62%.  He underwent normal carotid Dopplers on 05/12/2017.  He is a very pleasant 84 year old gentleman who is here with his daughter today, Dr. [name].  He has been in the [place] for about a month.  He had been living in [place] which is where he grew up.  He was born on [address] of [place] and then moved to [place].  He underwent cardiac catheterization in February 2019 and reportedly underwent stent placement to the LAD.  Dr. Lodema Hong said she will obtain the cardiac catheterization report and provide me with this.  He is generally feeling well.  He denies exertional chest pain and shortness of breath.  He denies palpitations, leg swelling, orthopnea, dizziness, and paroxysmal nocturnal dyspnea.  He has been following with the Chief of Cardiology at the [facility] in [place], [place].  Other relevant past medical history includes prostate abscess after a biopsy about 7 years ago.  He also had an inguinal hernia repair.  Dr. Lodema Hong tells me that he has had hypertension for at least 40 years but he has been doing well with this.  He is currently taking valsartan 80 mg daily.  He took a walk outside yesterday and enjoys doing daily walking and exercising.  He used to walk on the beach on a daily basis in [place].  His wife died in 2018/02/01.  He is a retired Geophysicist/field seismologist.  I reviewed an echocardiogram dated 02/18/2017 which demonstrated normal left ventricular systolic  function, LVEF 60 to 65%, mild concentric LVH, moderate right ventricular and mild right atrial enlargement, and moderate thickening of the noncoronary cusp of the aortic valve.  I reviewed a Holter monitor from 11/21/2017 which demonstrated predominantly sinus rhythm with episodes of sinus bradycardia and occasional PVCs.  I reviewed basic metabolic panel which demonstrated normal sodium potassium chloride and bicarb with normal renal function and a GFR greater than 60.  Hemoglobin A1c was 6.3%.  LDL was 1.5 mmol/L and the range is 1.5-3.6.  He has no history of pulmonary disease.  He has no history of sleep apnea and his daughter said he does not snore.  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    Allergies  Allergen Reactions  . Ace Inhibitors Swelling    Current Outpatient Medications  Medication Sig Dispense Refill  . aspirin 81 MG chewable tablet Chew by mouth daily.    Marland Kitchen atorvastatin (LIPITOR) 40 MG tablet Take 1 tablet (40 mg total) by mouth daily. 90 tablet 3  . Multiple Vitamin (MULTIVITAMIN) tablet Take 1 tablet by mouth daily.    . valsartan (DIOVAN) 80 MG tablet Take 1 tablet (80 mg total) by mouth daily. 90 tablet 3   No current facility-administered medications for this visit.     No past medical history on file.  Past Surgical History:  Procedure Laterality Date  . INGUINAL HERNIA REPAIR    .  PROSTATE BIOPSY    . STENT PLACEMENT VASCULAR ([facility] HX)  11/2017    Social History   Socioeconomic History  . Marital status: Married    Spouse name: Not on file  . Number of children: Not on file  . Years of education: Not on file  . Highest education level: Not on file  Occupational History  . Not on file  Social Needs  . Financial resource strain: Not on file  . Food insecurity:     Worry: Not on file    Inability: Not on file  . Transportation needs:    Medical: Not on file    Non-medical: Not on file  Tobacco Use  . Smoking status: Never Smoker  . Smokeless tobacco: Never Used  Substance and Sexual Activity  . Alcohol use: Not on file  . Drug use: Not on file  . Sexual activity: Not on file  Lifestyle  . Physical activity:    Days per week: Not on file    Minutes per session: Not on file  . Stress: Not on file  Relationships  . Social connections:    Talks on phone: Not on file    Gets together: Not on file    Attends religious service: Not on file    Active member of club or organization: Not on file    Attends meetings of clubs or organizations: Not on file    Relationship status: Not on file  . Intimate partner violence:    Fear of current or ex partner: Not on file    Emotionally abused: Not on file    Physically abused: Not on file    Forced sexual activity: Not on file  Other Topics Concern  . Not on file  Social History Narrative  . Not on file     No family history of premature CAD in 1st degree relatives.  No outpatient medications have been marked as taking for the 08/20/18 encounter (Office Visit) with Laqueta LindenKoneswaran, Suresh A, MD.      Review of systems complete and found to be negative unless listed above in HPI    Physical exam Blood pressure (!) 164/78, pulse 69, height 5\' 11"  (1.803 m), weight 181 lb (82.1 kg), SpO2 95 %. General: NAD Neck: No JVD, no thyromegaly or thyroid nodule.  Lungs: Clear to auscultation bilaterally with normal respiratory effort. CV: Nondisplaced PMI. Regular rate and rhythm, normal S1/S2, no S3/S4, no murmur.  No peripheral edema.  No carotid bruit.    Abdomen: Soft, nontender, no distention.  Skin: Intact without lesions or rashes.  Neurologic: Alert and oriented x 3.  Psych: Normal affect. Extremities: No clubbing or cyanosis.  HEENT: Normal.   ECG: Most recent ECG reviewed.   Labs:     Lipids: Lab Results  Component Value Date/Time   [facility]  12/21/2009 04:13 AM    59        Total Cholesterol/HDL:CHD Risk Coronary Heart Disease Risk Table                     Men   Women  1/2 Average Risk   3.4   3.3  Average Risk       5.0   4.4  2 X Average Risk   9.6   7.1  3 X Average Risk  23.4   11.0        Use the calculated Patient Ratio above and the CHD Risk Table to determine the patient's CHD  Risk.        ATP III CLASSIFICATION (LDL):  <100     mg/dL   Optimal  161-096  mg/dL   Near or Above                    Optimal  130-159  mg/dL   Borderline  045-409  mg/dL   High  >811     mg/dL   Very High   CHOL  91/47/8295 04:13 AM    86        ATP III CLASSIFICATION:  <200     mg/dL   Desirable  621-308  mg/dL   Borderline High  >=657    mg/dL   High          TRIG 42 12/21/2009 04:13 AM   HDL 19 (L) 12/21/2009 04:13 AM        ASSESSMENT AND PLAN:  1.  Coronary artery disease: Symptomatically stable.  Underwent LAD stent placement in February 2019.  Dr. Lodema Hong will provide me with the cardiac catheterization report.  He exercises regularly and has no exertional symptoms.   Lipids reviewed above and found to be within normal limits.  Continue aspirin 81 mg and atorvastatin 40 mg.  Left ventricular systolic function is normal. In 6 months, I will obtain a comprehensive metabolic panel, CBC, and a lipid panel.  2.  Hypertension: Blood pressure is elevated today and valsartan was recently increased to 80 mg daily.  No changes to therapy.  3.  Hyperlipidemia: Lipids reviewed above and found to be within normal limits.  Continue atorvastatin 40 mg.  I will obtain a repeat lipid panel in 6 months.     Disposition: Follow up in 6 months  Signed: Prentice Docker, M.D., F.A.C.C.  08/20/2018, 5:07 PM   Addendum:  I reviewed records provided to me by Dr. Lodema Hong regarding her father.  Records are from "[facility] of [place] ".  It is  located in [place].  Cardiac catheterization and echocardiogram reports were provided.  He underwent proximal LAD stent placement with a Resolute 3.5 x 18 mm drug-eluting stent for a 95% stenosis.  There was 50% mid LAD stenosis beyond the first septal perforator and first diagonal. The circumflex and RCA were angiographically normal.  Echocardiogram on 11/19/2017 demonstrated mildly reduced left ventricular systolic function, LVEF 45 to 84%, mild concentric LVH, normal diastolic function for age, with "severe posteroinferior hypokinesis".  There was mild aortic regurgitation.  There was mild mitral annular calcification and moderate thickening of the anterior and posterior mitral leaflets.

## 2018-08-20 NOTE — Patient Instructions (Signed)
Medication Instructions:  Your physician recommends that you continue on your current medications as directed. Please refer to the Current Medication list given to you today.  If you need a refill on your cardiac medications before your next appointment, please call your pharmacy.   Lab work: NiSourceCBC,CMET,Lipids JUST BEFORE NEXT VISIT If you have labs (blood work) drawn today and your tests are completely normal, you will receive your results only by: Marland Kitchen. MyChart Message (if you have MyChart) OR . A paper copy in the mail If you have any lab test that is abnormal or we need to change your treatment, we will call you to review the results.  Testing/Procedures: none  Follow-Up: [facility], you and your health needs are our priority.  As part of our continuing mission to provide you with exceptional heart care, we have created designated Provider Care Teams.  These Care Teams include your primary Cardiologist (physician) and Advanced Practice Providers (APPs -  Physician Assistants and Nurse Practitioners) who all work together to provide you with the care you need, when you need it. . Follow up in 6 months with Dr.Koneswaran  Any Other Special Instructions Will Be Listed Below (If Applicable). None

## 2019-02-04 ENCOUNTER — Ambulatory Visit: Payer: Self-pay | Admitting: Cardiovascular Disease

## 2019-02-05 ENCOUNTER — Other Ambulatory Visit (HOSPITAL_COMMUNITY)
Admission: RE | Admit: 2019-02-05 | Discharge: 2019-02-05 | Disposition: A | Discharge status: Home / Self Care | Payer: Self-pay | Source: Other Acute Inpatient Hospital | Attending: Cardiovascular Disease | Admitting: Cardiovascular Disease

## 2019-02-05 DIAGNOSIS — I10 Essential (primary) hypertension: Secondary | ICD-10-CM | POA: Insufficient documentation

## 2019-02-05 LAB — CBC
HCT: 39.4 % (ref 39.0–52.0)
Hemoglobin: 12.6 g/dL — ABNORMAL LOW (ref 13.0–17.0)
MCH: 28.4 pg (ref 26.0–34.0)
MCHC: 32 g/dL (ref 30.0–36.0)
MCV: 88.9 fL (ref 80.0–100.0)
Platelets: 119 10*3/uL — ABNORMAL LOW (ref 150–400)
RBC: 4.43 MIL/uL (ref 4.22–5.81)
RDW: 15.2 % (ref 11.5–15.5)
WBC: 5.3 10*3/uL (ref 4.0–10.5)
nRBC: 0 % (ref 0.0–0.2)

## 2019-02-05 LAB — COMPREHENSIVE METABOLIC PANEL
ALT: 29 U/L (ref 0–44)
AST: 30 U/L (ref 15–41)
Albumin: 3.9 g/dL (ref 3.5–5.0)
Alkaline Phosphatase: 42 U/L (ref 38–126)
Anion gap: 10 (ref 5–15)
BUN: 28 mg/dL — ABNORMAL HIGH (ref 8–23)
CO2: 25 mmol/L (ref 22–32)
Calcium: 9.3 mg/dL (ref 8.9–10.3)
Chloride: 106 mmol/L (ref 98–111)
Creatinine, Ser: 1.36 mg/dL — ABNORMAL HIGH (ref 0.61–1.24)
GFR calc Af Amer: 52 mL/min — ABNORMAL LOW (ref >60)
GFR calc non Af Amer: 45 mL/min — ABNORMAL LOW (ref >60)
Glucose, Bld: 132 mg/dL — ABNORMAL HIGH (ref 70–99)
Potassium: 4.9 mmol/L (ref 3.5–5.1)
Sodium: 141 mmol/L (ref 135–145)
Total Bilirubin: 1.5 mg/dL — ABNORMAL HIGH (ref 0.3–1.2)
Total Protein: 7.3 g/dL (ref 6.5–8.1)

## 2019-02-05 LAB — LIPID PANEL
Cholesterol: 96 mg/dL (ref 0–200)
HDL: 46 mg/dL (ref >40)
LDL Cholesterol: 42 mg/dL (ref 0–99)
Total CHOL/HDL Ratio: 2.1 RATIO
Triglycerides: 42 mg/dL (ref <150)
VLDL: 8 mg/dL (ref 0–40)

## 2019-02-07 ENCOUNTER — Other Ambulatory Visit: Payer: Self-pay

## 2019-02-07 ENCOUNTER — Ambulatory Visit (INDEPENDENT_AMBULATORY_CARE_PROVIDER_SITE_OTHER): Payer: Self-pay | Admitting: Cardiovascular Disease

## 2019-02-07 ENCOUNTER — Encounter: Payer: Self-pay | Admitting: Cardiovascular Disease

## 2019-02-07 VITALS — BP 169/76 | HR 62 | Ht 71.0 in | Wt 189.0 lb

## 2019-02-07 DIAGNOSIS — Z955 Presence of coronary angioplasty implant and graft: Secondary | ICD-10-CM

## 2019-02-07 DIAGNOSIS — I10 Essential (primary) hypertension: Secondary | ICD-10-CM

## 2019-02-07 DIAGNOSIS — I25118 Atherosclerotic heart disease of native coronary artery with other forms of angina pectoris: Secondary | ICD-10-CM

## 2019-02-07 DIAGNOSIS — R0602 Shortness of breath: Secondary | ICD-10-CM

## 2019-02-07 DIAGNOSIS — E785 Hyperlipidemia, unspecified: Secondary | ICD-10-CM

## 2019-02-07 DIAGNOSIS — Z79899 Other long term (current) drug therapy: Secondary | ICD-10-CM

## 2019-02-07 MED ORDER — VALSARTAN 40 MG PO TABS: ORAL_TABLET | ORAL | 3 refills | Status: DC

## 2019-02-07 NOTE — Patient Instructions (Signed)
Medication Instructions: Take Valsartan 40 mg in the am  AND 80 mg in the evening  Labwork: BMET in 2 weeks  Procedures/Testing: None today  Follow-Up: 3 months with Dr.Koneswaran  Any Additional Special Instructions Will Be Listed Below (If Applicable).     If you need a refill on your cardiac medications before your next appointment, please call your pharmacy.      Thank you for choosing  [facility] !

## 2019-02-07 NOTE — Progress Notes (Signed)
SUBJECTIVE: [name] presents for routine follow-up.  His daughter is Dr. Syliva OvermanMargaret Ibarra.  She is here with him today.  He underwent proximal LAD stent placement in February 2019 in [place] with a Resolute 3.5 x 18 mm drug-eluting stent for a 95% stenosis.  There was 50% mid LAD stenosis beyond the first septal perforator and first diagonal. The circumflex and RCA were angiographically normal.  Echocardiogram on 11/19/2017 demonstrated mildly reduced left ventricular systolic function, LVEF 45 to 16%50%, mild concentric LVH, normal diastolic function for age, with "severe posteroinferior hypokinesis".  There was mild aortic regurgitation.  There was mild mitral annular calcification and moderate thickening of the anterior and posterior mitral leaflets.  ECG performed today which I ordered and personally reviewed demonstrates sinus rhythm with sinus arrhythmia and first-degree AV block, PR interval 232 ms, and probable LVH with repolarization abnormalities.  There is nonspecific intraventricular conduction delay.  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  T wave inversions in inferolateral leads are not markedly different when compared to ECG performed on 08/20/2018.  He has had a possible mild reduction in exercise tolerance.  However, he is bicycling for 40 minutes every day.  He denies exertional chest pain and denies orthopnea and leg swelling.  He went to [place] this past February.  The other day while playing Congohinese checkers with his daughter, he was a little short of breath.  She checked his heart rate and it was ranging from 120-130 bpm.  Lately, systolic blood pressures have been in the 160 range.  They have been known to fluctuate for most of his life.  He can also get short of breath at times while bathing.  He otherwise feels well.  Soc Hx: His wife died in April 2019. He is a retired Geophysicist/field seismologistMethodist minister. His daughter is Dr. Syliva OvermanMargaret Ibarra.   Review of Systems: As per  "subjective", otherwise negative.  Allergies  Allergen Reactions  . Ace Inhibitors Swelling    Current Outpatient Medications  Medication Sig Dispense Refill  . aspirin 81 MG chewable tablet Chew by mouth daily.    Marland Kitchen. atorvastatin (LIPITOR) 40 MG tablet Take 1 tablet (40 mg total) by mouth daily. 90 tablet 3  . Multiple Vitamin (MULTIVITAMIN) tablet Take 1 tablet by mouth daily.    . valsartan (DIOVAN) 80 MG tablet Take 1 tablet (80 mg total) by mouth daily. 90 tablet 3   No current facility-administered medications for this visit.     History reviewed. No pertinent past medical history.  Past Surgical History:  Procedure Laterality Date  . INGUINAL HERNIA REPAIR    . PROSTATE BIOPSY    . STENT PLACEMENT VASCULAR ([facility] HX)  11/2017    Social History   Socioeconomic History  . Marital status: Married    Spouse name: Not on file  . Number of children: Not on file  . Years of education: Not on file  . Highest education level: Not on file  Occupational History  . Not on file  Social Needs  . Financial resource strain: Not on file  . Food insecurity:    Worry: Not on file    Inability: Not on file  . Transportation needs:    Medical: Not on file    Non-medical: Not on file  Tobacco Use  . Smoking status: Never Smoker  . Smokeless tobacco: Never Used  Substance and Sexual Activity  . Alcohol use: Not on file  . Drug use: Not on file  .  Sexual activity: Not on file  Lifestyle  . Physical activity:    Days per week: Not on file    Minutes per session: Not on file  . Stress: Not on file  Relationships  . Social connections:    Talks on phone: Not on file    Gets together: Not on file    Attends religious service: Not on file    Active member of club or organization: Not on file    Attends meetings of clubs or organizations: Not on file    Relationship status: Not on file  . Intimate partner violence:    Fear of current or ex partner: Not on file    Emotionally  abused: Not on file    Physically abused: Not on file    Forced sexual activity: Not on file  Other Topics Concern  . Not on file  Social History Narrative  . Not on file     Vitals:   02/07/19 1119  BP: (!) 169/76  Pulse: 62  SpO2: 99%  Weight: 189 lb (85.7 kg)  Height: 5\' 11"  (1.803 m)    Wt Readings from Last 3 Encounters:  02/07/19 189 lb (85.7 kg)  08/20/18 181 lb (82.1 kg)  08/15/16 179 lb (81.2 kg)     PHYSICAL EXAM General: NAD HEENT: Normal. Neck: No JVD, no thyromegaly. Lungs: Clear to auscultation bilaterally with normal respiratory effort. CV: Regular rate and irregular rhythm, normal S1/S2, no S3/S4, no murmur. No pretibial or periankle edema.     Abdomen: Soft, nontender, no distention.  Neurologic: Alert and oriented.  Psych: Normal affect. Skin: Normal. Musculoskeletal: No gross deformities.    ECG: Reviewed above under Subjective   Labs: Recent Results (from the past 2160 hour(s))  Comprehensive metabolic panel     Status: Abnormal   Collection Time: 02/05/19 10:46 AM  Result Value Ref Range   Sodium 141 135 - 145 mmol/L   Potassium 4.9 3.5 - 5.1 mmol/L   Chloride 106 98 - 111 mmol/L   CO2 25 22 - 32 mmol/L   Glucose, Bld 132 (H) 70 - 99 mg/dL   BUN 28 (H) 8 - 23 mg/dL   Creatinine, Ser 7.86 (H) 0.61 - 1.24 mg/dL   Calcium 9.3 8.9 - 76.7 mg/dL   Total Protein 7.3 6.5 - 8.1 g/dL   Albumin 3.9 3.5 - 5.0 g/dL   AST 30 15 - 41 U/L   ALT 29 0 - 44 U/L   Alkaline Phosphatase 42 38 - 126 U/L   Total Bilirubin 1.5 (H) 0.3 - 1.2 mg/dL   GFR calc non Af Amer 45 (L) >60 mL/min   GFR calc Af Amer 52 (L) >60 mL/min   Anion gap 10 5 - 15    Comment: Performed at [facility], [address] [place], [place] [address]  Lipid panel     Status: None   Collection Time: 02/05/19 10:46 AM  Result Value Ref Range   Cholesterol 96 0 - 200 mg/dL   Triglycerides 42 <096 mg/dL   HDL 46 >28 mg/dL   Total CHOL/HDL Ratio 2.1 RATIO   VLDL 8 0 - 40 mg/dL    LDL Cholesterol 42 0 - 99 mg/dL    Comment:        Total Cholesterol/HDL:CHD Risk Coronary Heart Disease Risk Table                     Men   Women  1/2 Average  Risk   3.4   3.3  Average Risk       5.0   4.4  2 X Average Risk   9.6   7.1  3 X Average Risk  23.4   11.0        Use the calculated Patient Ratio above and the CHD Risk Table to determine the patient's CHD Risk.        ATP III CLASSIFICATION (LDL):  <100     mg/dL   Optimal  161-096  mg/dL   Near or Above                    Optimal  130-159  mg/dL   Borderline  045-409  mg/dL   High  >811     mg/dL   Very High Performed at [facility], [address], [place], [place] [address]   CBC     Status: Abnormal   Collection Time: 02/05/19 10:46 AM  Result Value Ref Range   WBC 5.3 4.0 - 10.5 K/uL   RBC 4.43 4.22 - 5.81 MIL/uL   Hemoglobin 12.6 (L) 13.0 - 17.0 g/dL   HCT 29.5 62.1 - 30.8 %   MCV 88.9 80.0 - 100.0 fL   MCH 28.4 26.0 - 34.0 pg   MCHC 32.0 30.0 - 36.0 g/dL   RDW 65.7 84.6 - 96.2 %   Platelets 119 (L) 150 - 400 K/uL    Comment: REPEATED TO VERIFY PLATELET COUNT CONFIRMED BY SMEAR SPECIMEN CHECKED FOR CLOTS    nRBC 0.0 0.0 - 0.2 %    Comment: Performed at [facility], [address] [place], [place] [address]      Lipids: Lab Results  Component Value Date/Time   LDLCALC 42 02/05/2019 10:46 AM   CHOL 96 02/05/2019 10:46 AM   TRIG 42 02/05/2019 10:46 AM   HDL 46 02/05/2019 10:46 AM       ASSESSMENT AND PLAN:  1.  Coronary artery disease:  He has some mild exertional dyspnea which is sporadic. Underwent LAD stent placement in February 2019.  Lipids reviewed above and found to be within normal limits.  Continue aspirin 81 mg and atorvastatin 40 mg.  Left ventricular systolic function is mildly reduced.  I will aim to control blood pressure by adding valsartan 40 mg every morning.  He already takes 80 mg every evening.  2.  Hypertension: Blood pressure is elevated today and he is taking valsartan  80 mg every evening.  I will add 40 mg of valsartan every morning and check BMET in 2 weeks.  3.  Hyperlipidemia: Lipids reviewed above and found to be within normal limits.  Continue atorvastatin 40 mg.   4. CKD stage III: Cr 1.36 on 02/05/19. Will repeat in 2 weeks given increase of valsartan.  5. Anemia/thrombocytopenia: Mildly anemic (hgb 12.6) and thrombocytopenic (plts 119). Will need continued monitoring.    Disposition: Follow up 3 months   Prentice Docker, M.D., F.A.C.C.

## 2019-02-07 NOTE — Addendum Note (Signed)
Addended by: Marlyn Corporal A on: 02/07/2019 03:29 PM   Modules accepted: Orders

## 2019-02-08 ENCOUNTER — Ambulatory Visit: Payer: Self-pay | Admitting: Cardiovascular Disease

## 2019-02-25 ENCOUNTER — Encounter: Payer: Self-pay | Admitting: Family Medicine

## 2019-02-25 LAB — HM DIABETES EYE EXAM

## 2019-03-14 ENCOUNTER — Telehealth: Payer: Self-pay | Admitting: Cardiovascular Disease

## 2019-03-14 NOTE — Telephone Encounter (Signed)
Opened in error

## 2019-03-17 ENCOUNTER — Telehealth: Payer: Self-pay | Admitting: *Deleted

## 2019-03-17 NOTE — Telephone Encounter (Signed)
Notes recorded by Laurine Blazer, LPN on 2/40/9735 at 32:99 AM EDT  Daughter (Dr. Tula Nakayama) notified. Copy to pmd & Dr. Moshe Cipro.  ------   Notes recorded by Laurine Blazer, LPN on 11/09/2681 at 41:96 AM EDT  Left message to return call.   ------   Notes recorded by Herminio Commons, MD on 03/14/2019 at 5:03 PM EDT  Renal function has improved.

## 2019-05-13 ENCOUNTER — Ambulatory Visit: Payer: Self-pay | Admitting: Cardiovascular Disease

## 2019-07-24 ENCOUNTER — Other Ambulatory Visit: Payer: Self-pay | Admitting: Cardiovascular Disease

## 2019-08-19 ENCOUNTER — Other Ambulatory Visit: Payer: Self-pay

## 2019-08-19 ENCOUNTER — Ambulatory Visit: Payer: Self-pay | Admitting: Family Medicine

## 2019-09-02 ENCOUNTER — Ambulatory Visit (INDEPENDENT_AMBULATORY_CARE_PROVIDER_SITE_OTHER): Payer: Self-pay | Admitting: Family Medicine

## 2019-09-02 ENCOUNTER — Other Ambulatory Visit: Payer: Self-pay

## 2019-09-02 DIAGNOSIS — I10 Essential (primary) hypertension: Secondary | ICD-10-CM

## 2019-09-02 DIAGNOSIS — Z79899 Other long term (current) drug therapy: Secondary | ICD-10-CM

## 2019-09-02 MED ORDER — ATORVASTATIN CALCIUM 40 MG PO TABS: 40.0000 mg | ORAL_TABLET | Freq: Every day | ORAL | 1 refills | Status: DC

## 2019-09-02 MED ORDER — VALSARTAN 80 MG PO TABS: 80.0000 mg | ORAL_TABLET | Freq: Every day | ORAL | 1 refills | Status: DC

## 2019-09-02 NOTE — Progress Notes (Signed)
   Subjective:    Patient ID: Roy Ibarra, male    DOB: 05/14/1923, 85 y.o.   MRN: [id number]  Hypertension This is a chronic problem. The current episode started more than 1 year ago. Risk factors for coronary artery disease include dyslipidemia. Treatments tried: diovan. There are no compliance problems.    Had stent done via cardiology and follows up with them regularly.  Get up to urinate a lot at night  Needs repeat blood work  Needs 90 day of meds    Virtual Visit via Video Note  I connected with Roy Ibarra on 09/02/19 at  1:10 PM EST by a video enabled telemedicine application and verified that I am speaking with the correct person using two identifiers.  Location: Patient: home Provider: office   I discussed the limitations of evaluation and management by telemedicine and the availability of in person appointments. The patient expressed understanding and agreed to proceed.  History of Present Illness:    Observations/Objective:   Assessment and Plan:   Follow Up Instructions:    I discussed the assessment and treatment plan with the patient. The patient was provided an opportunity to ask questions and all were answered. The patient agreed with the plan and demonstrated an understanding of the instructions.   The patient was advised to call back or seek an in-person evaluation if the symptoms worsen or if the condition fails to improve as anticipated.  I provided 18 minutes of non-face-to-face time during this encounter.   150 70 patient tried stronger medication for blood pressure but became weak on this.  Overall doing very well   His daughter who is a family doctor is keeping a close eye on him.  He is grateful to have her assistance and as always you for this is a very nice gentleman to interact with   Review of Systems No headache no chest pain no shortness of breath    Objective:   Physical Exam  Virtual      Assessment & Plan:   Impression 1 hypertension.  Apparent decent control.  Unable to take stronger doses will maintain same dose rationale discussed  2.  Hyperlipidemia.  Prior blood work good discussed  3.  Coronary artery disease.  Status post stent.  Yearly well.  Follow-up in 6 months

## 2019-09-10 LAB — CBC WITH DIFFERENTIAL/PLATELET
Basophils Absolute: 0 x10E3/uL (ref 0.0–0.2)
Basos: 0 %
EOS (ABSOLUTE): 0.3 x10E3/uL (ref 0.0–0.4)
Eos: 6 %
Hematocrit: 35.7 % — ABNORMAL LOW (ref 37.5–51.0)
Hemoglobin: 12 g/dL — ABNORMAL LOW (ref 13.0–17.7)
Immature Grans (Abs): 0 x10E3/uL (ref 0.0–0.1)
Immature Granulocytes: 0 %
Lymphocytes Absolute: 1.3 x10E3/uL (ref 0.7–3.1)
Lymphs: 24 %
MCH: 28.6 pg (ref 26.6–33.0)
MCHC: 33.6 g/dL (ref 31.5–35.7)
MCV: 85 fL (ref 79–97)
Monocytes Absolute: 0.9 x10E3/uL (ref 0.1–0.9)
Monocytes: 17 %
Neutrophils Absolute: 2.8 x10E3/uL (ref 1.4–7.0)
Neutrophils: 53 %
Platelets: 164 x10E3/uL (ref 150–450)
RBC: 4.2 x10E6/uL (ref 4.14–5.80)
RDW: 12.7 % (ref 11.6–15.4)
WBC: 5.4 x10E3/uL (ref 3.4–10.8)

## 2019-09-10 LAB — BASIC METABOLIC PANEL
BUN/Creatinine Ratio: 16 (ref 10–24)
BUN: 21 mg/dL (ref 10–36)
CO2: 22 mmol/L (ref 20–29)
Calcium: 9.3 mg/dL (ref 8.6–10.2)
Chloride: 104 mmol/L (ref 96–106)
Creatinine, Ser: 1.33 mg/dL — ABNORMAL HIGH (ref 0.76–1.27)
GFR calc Af Amer: 53 mL/min/{1.73_m2} — ABNORMAL LOW (ref >59)
GFR calc non Af Amer: 45 mL/min/{1.73_m2} — ABNORMAL LOW (ref >59)
Glucose: 137 mg/dL — ABNORMAL HIGH (ref 65–99)
Potassium: 4.7 mmol/L (ref 3.5–5.2)
Sodium: 138 mmol/L (ref 134–144)

## 2019-09-10 LAB — LIPID PANEL
Chol/HDL Ratio: 1.9 ratio (ref 0.0–5.0)
Cholesterol, Total: 94 mg/dL — ABNORMAL LOW (ref 100–199)
HDL: 49 mg/dL (ref >39)
LDL Chol Calc (NIH): 33 mg/dL (ref 0–99)
Triglycerides: 43 mg/dL (ref 0–149)
VLDL Cholesterol Cal: 12 mg/dL (ref 5–40)

## 2019-09-11 ENCOUNTER — Encounter: Payer: Self-pay | Admitting: Family Medicine

## 2019-11-04 ENCOUNTER — Ambulatory Visit: Payer: Self-pay

## 2019-11-09 ENCOUNTER — Ambulatory Visit: Payer: Self-pay

## 2019-11-10 ENCOUNTER — Encounter: Payer: Self-pay | Admitting: Family Medicine

## 2019-11-12 ENCOUNTER — Ambulatory Visit: Payer: Self-pay | Attending: Internal Medicine

## 2019-11-12 ENCOUNTER — Ambulatory Visit: Payer: Self-pay

## 2019-11-12 DIAGNOSIS — Z23 Encounter for immunization: Secondary | ICD-10-CM | POA: Insufficient documentation

## 2019-11-12 NOTE — Progress Notes (Signed)
   Covid-19 Vaccination Clinic  Name:  Roy Ibarra    MRN: [id number] DOB: 07/20/1923  11/12/2019  [name] was observed post Covid-19 immunization for 15 minutes without incidence. He was provided with Vaccine Information Sheet and instruction to access the V-Safe system.   [name] was instructed to call 911 with any severe reactions post vaccine: Marland Kitchen Difficulty breathing  . Swelling of your face and throat  . A fast heartbeat  . A bad rash all over your body  . Dizziness and weakness    Immunizations Administered    Name Date Dose VIS Date Route   Moderna COVID-19 Vaccine 11/12/2019 12:28 PM 0.5 mL 09/06/2019 Intramuscular   Manufacturer: Moderna   Lot: [id number]   NDC: 14709-295-74

## 2019-12-06 ENCOUNTER — Ambulatory Visit: Payer: Self-pay | Attending: Internal Medicine

## 2019-12-06 DIAGNOSIS — Z23 Encounter for immunization: Secondary | ICD-10-CM | POA: Insufficient documentation

## 2019-12-06 NOTE — Progress Notes (Signed)
   Covid-19 Vaccination Clinic  Name:  Valin Massie    MRN: [id number] DOB: 1922-12-12  12/06/2019  Mr. Palmeri was observed post Covid-19 immunization for 15 minutes without incident. He was provided with Vaccine Information Sheet and instruction to access the V-Safe system.   Mr. Wasko was instructed to call 911 with any severe reactions post vaccine: Marland Kitchen Difficulty breathing  . Swelling of face and throat  . A fast heartbeat  . A bad rash all over body  . Dizziness and weakness   Immunizations Administered    Name Date Dose VIS Date Route   Moderna COVID-19 Vaccine 12/06/2019  4:15 PM 0.5 mL 09/06/2019 Intramuscular   Manufacturer: Moderna   Lot: [id number]   NDC: 97673-419-37

## 2019-12-13 ENCOUNTER — Ambulatory Visit: Payer: Self-pay

## 2020-01-11 ENCOUNTER — Other Ambulatory Visit: Payer: Self-pay | Admitting: *Deleted

## 2020-01-11 ENCOUNTER — Telehealth: Payer: Self-pay | Admitting: *Deleted

## 2020-01-11 DIAGNOSIS — Z79899 Other long term (current) drug therapy: Secondary | ICD-10-CM

## 2020-01-11 DIAGNOSIS — E785 Hyperlipidemia, unspecified: Secondary | ICD-10-CM

## 2020-01-11 NOTE — Telephone Encounter (Signed)
Spoke with Daughter (Dr. Lodema Hong) who requested labs be done at [facility] prior to next appt. IM sent to Dr. Purvis Sheffield requesting what labs needed to be done ( CMET, CBC, Lipids). Notified daughter of appt change to 01/20/20 at 3:40.

## 2020-01-14 LAB — CBC
Hematocrit: 37.4 % — ABNORMAL LOW (ref 37.5–51.0)
Hemoglobin: 12.2 g/dL — ABNORMAL LOW (ref 13.0–17.7)
MCH: 28.7 pg (ref 26.6–33.0)
MCHC: 32.6 g/dL (ref 31.5–35.7)
MCV: 88 fL (ref 79–97)
Platelets: 133 10*3/uL — ABNORMAL LOW (ref 150–450)
RBC: 4.25 x10E6/uL (ref 4.14–5.80)
RDW: 13.1 % (ref 11.6–15.4)
WBC: 5.3 10*3/uL (ref 3.4–10.8)

## 2020-01-14 LAB — COMPREHENSIVE METABOLIC PANEL
ALT: 28 IU/L (ref 0–44)
AST: 29 IU/L (ref 0–40)
Albumin/Globulin Ratio: 1.3 (ref 1.2–2.2)
Albumin: 4 g/dL (ref 3.5–4.6)
Alkaline Phosphatase: 49 IU/L (ref 39–117)
BUN/Creatinine Ratio: 15 (ref 10–24)
BUN: 21 mg/dL (ref 10–36)
Bilirubin Total: 0.8 mg/dL (ref 0.0–1.2)
CO2: 23 mmol/L (ref 20–29)
Calcium: 9.3 mg/dL (ref 8.6–10.2)
Chloride: 104 mmol/L (ref 96–106)
Creatinine, Ser: 1.36 mg/dL — ABNORMAL HIGH (ref 0.76–1.27)
GFR calc Af Amer: 51 mL/min/{1.73_m2} — ABNORMAL LOW (ref >59)
GFR calc non Af Amer: 44 mL/min/{1.73_m2} — ABNORMAL LOW (ref >59)
Globulin, Total: 3 g/dL (ref 1.5–4.5)
Glucose: 142 mg/dL — ABNORMAL HIGH (ref 65–99)
Potassium: 4.7 mmol/L (ref 3.5–5.2)
Sodium: 140 mmol/L (ref 134–144)
Total Protein: 7 g/dL (ref 6.0–8.5)

## 2020-01-14 LAB — LIPID PANEL
Chol/HDL Ratio: 1.9 ratio (ref 0.0–5.0)
Cholesterol, Total: 99 mg/dL — ABNORMAL LOW (ref 100–199)
HDL: 51 mg/dL (ref >39)
LDL Chol Calc (NIH): 35 mg/dL (ref 0–99)
Triglycerides: 53 mg/dL (ref 0–149)
VLDL Cholesterol Cal: 13 mg/dL (ref 5–40)

## 2020-01-18 ENCOUNTER — Ambulatory Visit: Payer: Self-pay | Admitting: Cardiovascular Disease

## 2020-01-20 ENCOUNTER — Ambulatory Visit (INDEPENDENT_AMBULATORY_CARE_PROVIDER_SITE_OTHER): Payer: 59 | Admitting: Cardiovascular Disease

## 2020-01-20 ENCOUNTER — Other Ambulatory Visit: Payer: Self-pay

## 2020-01-20 ENCOUNTER — Encounter: Payer: Self-pay | Admitting: Cardiovascular Disease

## 2020-01-20 VITALS — BP 160/80 | HR 69 | Temp 97.5°F | Ht 71.0 in | Wt 191.0 lb

## 2020-01-20 DIAGNOSIS — Z955 Presence of coronary angioplasty implant and graft: Secondary | ICD-10-CM

## 2020-01-20 DIAGNOSIS — R0602 Shortness of breath: Secondary | ICD-10-CM

## 2020-01-20 DIAGNOSIS — I25118 Atherosclerotic heart disease of native coronary artery with other forms of angina pectoris: Secondary | ICD-10-CM

## 2020-01-20 DIAGNOSIS — N183 Chronic kidney disease, stage 3 unspecified: Secondary | ICD-10-CM

## 2020-01-20 DIAGNOSIS — I255 Ischemic cardiomyopathy: Secondary | ICD-10-CM

## 2020-01-20 DIAGNOSIS — I10 Essential (primary) hypertension: Secondary | ICD-10-CM | POA: Diagnosis not present

## 2020-01-20 DIAGNOSIS — E785 Hyperlipidemia, unspecified: Secondary | ICD-10-CM | POA: Diagnosis not present

## 2020-01-20 NOTE — Progress Notes (Signed)
SUBJECTIVE: Roy Ibarra presents for routine follow-up.  His daughter is Dr. Tula Nakayama. Her husband, Shirlean Mylar, is here today.  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  In summary, he underwent proximal LAD stent placement in February 2019 in [place] with a Resolute 3.5 x 18 mm drug-eluting stentfor a 95% stenosis.There was 50% mid LAD stenosis beyond the first septal perforator and first diagonal. The circumflex and RCA were angiographically normal.  Echocardiogram on 11/19/2017 demonstrated mildly reduced left ventricular systolic function, LVEF 45 to 50%, mild concentric LVH, normal diastolic function for age, with"severe posteroinferior hypokinesis". There was mild aortic regurgitation. There was mild mitral annular calcification and moderate thickening of the anterior and posterior mitral leaflets.  I recently ordered labs to be done on 01/13/2020 which demonstrated the following: Hemoglobin 12.2, platelets 133, creatinine 1.36, and LDL 35.  He has episodic shortness of breath when walking or taking the stair lift to the top.  His son-in-law has noticed that he bends his head and become short of breath.  There is not to be any progression of this.  I also spoke with Dr. Moshe Cipro over the phone regarding this.  His son-in-law says he has a chronic cough.  Joycelyn Schmid says there is no evidence of leg swelling, orthopnea, or paroxysmal nocturnal dyspnea.  His son-in-law says that he becomes lightheaded and has to catch himself to hold his balance but there have been no falls.  There was apparently an isolated episode of chest discomfort or fullness about 2 to 3 weeks ago.  The patient attributes it to eating a large meal.  After he undid his pants button, he felt much better.  He occasionally walks around the block which is less than half a mile.  Systolic blood pressures generally run in the 150-160 range at home and Dr. Moshe Cipro is comfortable with this.  He previously did not  tolerate valsartan and is on amlodipine 10 mg.  Valsartan made him lightheaded.  I personally reviewed ECG performed today which demonstrates sinus rhythm with first-degree AV block, PR interval 260 ms, probable LVH with nonspecific IVCD and repolarization abnormalities in high lateral leads, and anteroseptal Q waves.   Soc Hx: His wife died in 2018/01/29. He is a retired Designer, television/film set. His daughter is Dr. Tula Nakayama.  Review of Systems: As per "subjective", otherwise negative.  Allergies  Allergen Reactions  . Ace Inhibitors Swelling    Current Outpatient Medications  Medication Sig Dispense Refill  . amLODipine (NORVASC) 10 MG tablet Take 10 mg by mouth daily.    Marland Kitchen aspirin 81 MG chewable tablet Chew by mouth daily.    Marland Kitchen atorvastatin (LIPITOR) 40 MG tablet Take 1 tablet (40 mg total) by mouth daily. 90 tablet 1  . Multiple Vitamin (MULTIVITAMIN) tablet Take 1 tablet by mouth daily.    Marland Kitchen PRESCRIPTION MEDICATION Eyes drops glaucoma     No current facility-administered medications for this visit.    History reviewed. No pertinent past medical history.  Past Surgical History:  Procedure Laterality Date  . INGUINAL HERNIA REPAIR    . PROSTATE BIOPSY    . STENT PLACEMENT VASCULAR ([facility] HX)  11/2017    Social History   Socioeconomic History  . Marital status: Married    Spouse name: Not on file  . Number of children: Not on file  . Years of education: Not on file  . Highest education level: Not on file  Occupational History  . Not on  file  Tobacco Use  . Smoking status: Never Smoker  . Smokeless tobacco: Never Used  Substance and Sexual Activity  . Alcohol use: Not Currently  . Drug use: Never  . Sexual activity: Not on file  Other Topics Concern  . Not on file  Social History Narrative  . Not on file   Social Determinants of Health   Financial Resource Strain:   . Difficulty of Paying Living Expenses:   Food Insecurity:   . Worried About Patent examiner in the Last Year:   . Barista in the Last Year:   Transportation Needs:   . Freight forwarder (Medical):   Marland Kitchen Lack of Transportation (Non-Medical):   Physical Activity:   . Days of Exercise per Week:   . Minutes of Exercise per Session:   Stress:   . Feeling of Stress :   Social Connections:   . Frequency of Communication with Friends and Family:   . Frequency of Social Gatherings with Friends and Family:   . Attends Religious Services:   . Active Member of Clubs or Organizations:   . Attends Banker Meetings:   Marland Kitchen Marital Status:   Intimate Partner Violence:   . Fear of Current or Ex-Partner:   . Emotionally Abused:   Marland Kitchen Physically Abused:   . Sexually Abused:      Vitals:   01/20/20 1535  BP: (!) 160/80  Pulse: 69  Temp: (!) 97.5 F (36.4 C)  SpO2: 96%  Weight: 191 lb (86.6 kg)  Height: 5\' 11"  (1.803 m)    Wt Readings from Last 3 Encounters:  01/20/20 191 lb (86.6 kg)  02/07/19 189 lb (85.7 kg)  08/20/18 181 lb (82.1 kg)     PHYSICAL EXAM General: NAD HEENT: Normal. Neck: No JVD, no thyromegaly. Lungs: Clear to auscultation bilaterally with normal respiratory effort. CV: Regular rate and rhythm, normal S1/S2, no S3/S4, no murmur. No pretibial or periankle edema.  No carotid bruit.   Abdomen: Soft, nontender, no distention.  Neurologic: Alert and oriented.  Psych: Normal affect. Skin: Normal. Musculoskeletal: No gross deformities.      Labs:    Lipids: Lab Results  Component Value Date/Time   LDLCALC 35 01/13/2020 08:24 AM   CHOL 99 (L) 01/13/2020 08:24 AM   TRIG 53 01/13/2020 08:24 AM   HDL 51 01/13/2020 08:24 AM       ASSESSMENT AND PLAN:  1. Coronary artery disease:He underwent LAD stent placement in February 2019. Lipids reviewed above and found to be within normal limits. Continue aspirin 81 mgand atorvastatin 40 mg. Left ventricular systolic function is mildly reduced.   I will obtain a follow-up  echocardiogram given his episodic shortness of breath.  If he has progressive exertional dyspnea, he would likely need a Lexiscan Myoview to assess for any significant ischemia in the region of the LAD that had previously had a 50% stenosis.  2. Hypertension: Blood pressure is elevatedtoday and he is taking  amlodipine 10 mg daily.  Systolic blood pressures generally run in the 150-160 range at home and Dr. March 2019 is comfortable with this.  No changes to therapy.  3. Hyperlipidemia: Lipids reviewed above and found to be within normal limits. Continue atorvastatin 40 mg.   4. CKD stage III: Cr 1.36 on 01/13/2020.  5. Anemia/thrombocytopenia: Mildly anemic (hgb 12.2) and thrombocytopenic (plts 133). Will need continued monitoring.  6.  Ischemic cardiomyopathy/shortness of breath: LVEF 45 to 50% by echocardiogram in  February 2019.  He has episodic shortness of breath.  I will obtain a follow-up echocardiogram to assess for interval changes in cardiac structure and function.  I will also check a BNP.   Disposition: Follow up 1 yr   Prentice Docker, M.D., F.A.C.C.

## 2020-01-20 NOTE — Patient Instructions (Signed)
Medication Instructions:  Your physician recommends that you continue on your current medications as directed. Please refer to the Current Medication list given to you today.  *If you need a refill on your cardiac medications before your next appointment, please call your pharmacy*   Lab Work: BNP  If you have labs (blood work) drawn today and your tests are completely normal, you will receive your results only by: Marland Kitchen MyChart Message (if you have MyChart) OR . A paper copy in the mail If you have any lab test that is abnormal or we need to change your treatment, we will call you to review the results.   Testing/Procedures: Your physician has requested that you have an echocardiogram. Echocardiography is a painless test that uses sound waves to create images of your heart. It provides your doctor with information about the size and shape of your heart and how well your heart's chambers and valves are working. This procedure takes approximately one hour. There are no restrictions for this procedure.      Follow-Up: At [facility], you and your health needs are our priority.  As part of our continuing mission to provide you with exceptional heart care, we have created designated Provider Care Teams.  These Care Teams include your primary Cardiologist (physician) and Advanced Practice Providers (APPs -  Physician Assistants and Nurse Practitioners) who all work together to provide you with the care you need, when you need it.  We recommend signing up for the patient portal called "MyChart".  Sign up information is provided on this After Visit Summary.  MyChart is used to connect with patients for Virtual Visits (Telemedicine).  Patients are able to view lab/test results, encounter notes, upcoming appointments, etc.  Non-urgent messages can be sent to your provider as well.   To learn more about what you can do with MyChart, go to [email or web address].    Your next appointment:   12  month(s)  The format for your next appointment:   In Person  Provider:   Prentice Docker, MD   Other Instructions None       Thank you for choosing [facility] !

## 2020-01-23 ENCOUNTER — Telehealth: Payer: Self-pay

## 2020-01-23 MED ORDER — VALSARTAN 80 MG PO TABS: 80.0000 mg | ORAL_TABLET | Freq: Every day | ORAL | 3 refills | Status: DC

## 2020-01-23 MED ORDER — ATORVASTATIN CALCIUM 40 MG PO TABS: 40.0000 mg | ORAL_TABLET | Freq: Every day | ORAL | 3 refills | Status: DC

## 2020-01-23 NOTE — Telephone Encounter (Signed)
-----   Message from Laqueta Linden, MD sent at 01/23/2020  1:40 PM EDT ----- Regarding: Valsartan 80 mg Dr. Lodema Hong contacted me and told me that she made a mistake.  Her father is not taking amlodipine but is taking valsartan 80 mg daily.  She requested a 90-day supply of this medication.  Please provide at least enough refills for 1 year.  Thank you.  Dr. Purvis Sheffield

## 2020-02-14 ENCOUNTER — Other Ambulatory Visit (HOSPITAL_COMMUNITY): Payer: 59

## 2020-02-17 ENCOUNTER — Other Ambulatory Visit: Payer: Self-pay

## 2020-02-17 ENCOUNTER — Ambulatory Visit (HOSPITAL_COMMUNITY)
Admission: RE | Admit: 2020-02-17 | Discharge: 2020-02-17 | Disposition: A | Discharge status: Home / Self Care | Payer: 59 | Source: Ambulatory Visit | Attending: Cardiovascular Disease | Admitting: Cardiovascular Disease

## 2020-02-17 DIAGNOSIS — I255 Ischemic cardiomyopathy: Secondary | ICD-10-CM | POA: Insufficient documentation

## 2020-02-17 DIAGNOSIS — I25118 Atherosclerotic heart disease of native coronary artery with other forms of angina pectoris: Secondary | ICD-10-CM | POA: Insufficient documentation

## 2020-02-17 NOTE — Progress Notes (Signed)
*  PRELIMINARY RESULTS* Echocardiogram 2D Echocardiogram has been performed.  Roy Ibarra 02/17/2020, 3:30 PM

## 2020-07-20 ENCOUNTER — Ambulatory Visit (INDEPENDENT_AMBULATORY_CARE_PROVIDER_SITE_OTHER): Payer: 59 | Admitting: Family Medicine

## 2020-07-20 ENCOUNTER — Encounter: Payer: Self-pay | Admitting: Family Medicine

## 2020-07-20 VITALS — BP 122/82 | HR 84 | Temp 98.0°F | Ht 71.0 in | Wt 185.0 lb

## 2020-07-20 DIAGNOSIS — D649 Anemia, unspecified: Secondary | ICD-10-CM

## 2020-07-20 DIAGNOSIS — Z23 Encounter for immunization: Secondary | ICD-10-CM

## 2020-07-20 DIAGNOSIS — I10 Essential (primary) hypertension: Secondary | ICD-10-CM | POA: Diagnosis not present

## 2020-07-20 DIAGNOSIS — N401 Enlarged prostate with lower urinary tract symptoms: Secondary | ICD-10-CM | POA: Diagnosis not present

## 2020-07-20 DIAGNOSIS — R350 Frequency of micturition: Secondary | ICD-10-CM

## 2020-07-20 DIAGNOSIS — Z955 Presence of coronary angioplasty implant and graft: Secondary | ICD-10-CM | POA: Diagnosis not present

## 2020-07-20 DIAGNOSIS — E785 Hyperlipidemia, unspecified: Secondary | ICD-10-CM

## 2020-07-20 NOTE — Progress Notes (Signed)
Patient ID: Roy Ibarra, male    DOB: Oct 08, 1922, 86 y.o.   MRN: [id number]   Chief Complaint  Patient presents with  . Hypertension    follow up   Subjective:    HPI  Patient seen for follow-up of hypertension.  Patient seen today with his daughter, she is a family medicine physician in the area, Dr. Moshe Ibarra. Getting meds from Dr. Raliegh Ip from cardiology.   Pt seen for f/u HTN.  Pt doing well and under good care with daughter keeping up with his medications and appts.  A couple of years ago had a stent placed.  Seeing cardiology 1x per year.  htn- No dizziness, chest pain, sob, or leg swelling. Taking medications as directed.   Has some increase in urination in past but doesn't want to take medication for this.  They tried in the past and made him sleepy/dizzy.  Not completely incontinent but feeling increase in frequent urination.  Pt has h/o BPH.  H/o chronic anemia- Hb 12, stable.    Medical History [place] has no past medical history on file.   Outpatient Encounter Medications as of 07/20/2020  Medication Sig  . aspirin 81 MG chewable tablet Chew by mouth daily.  Marland Kitchen atorvastatin (LIPITOR) 40 MG tablet Take 1 tablet (40 mg total) by mouth daily.  . Multiple Vitamin (MULTIVITAMIN) tablet Take 1 tablet by mouth daily.  Marland Kitchen PRESCRIPTION MEDICATION Eyes drops glaucoma  . valsartan (DIOVAN) 80 MG tablet Take 1 tablet (80 mg total) by mouth daily.   No facility-administered encounter medications on file as of 07/20/2020.     Review of Systems  Constitutional: Negative for chills and fever.  HENT: Negative for congestion, rhinorrhea and sore throat.   Respiratory: Negative for cough, shortness of breath and wheezing.   Cardiovascular: Negative for chest pain and leg swelling.  Gastrointestinal: Negative for abdominal pain, diarrhea, nausea and vomiting.  Genitourinary: Positive for frequency (chronic). Negative for decreased urine volume, difficulty urinating, dysuria  and urgency.  Skin: Negative for rash.  Neurological: Negative for dizziness, weakness and headaches.     Vitals BP 122/82   Pulse 84   Temp 98 F (36.7 C) (Oral)   Ht _0  (1.803 m)   Wt 185 lb (83.9 kg)   SpO2 96%   BMI 25.80 kg/m   Objective:   Physical Exam Vitals reviewed.  Constitutional:      General: He is not in acute distress.    Appearance: Normal appearance. He is not ill-appearing.  HENT:     Head: Normocephalic.     Right Ear: Tympanic membrane, ear canal and external ear normal.     Left Ear: Tympanic membrane, ear canal and external ear normal.     Nose: Nose normal. No congestion.     Mouth/Throat:     Mouth: Mucous membranes are moist.     Pharynx: No oropharyngeal exudate.  Eyes:     Extraocular Movements: Extraocular movements intact.     Conjunctiva/sclera: Conjunctivae normal.     Pupils: Pupils are equal, round, and reactive to light.  Cardiovascular:     Rate and Rhythm: Normal rate and regular rhythm.     Pulses: Normal pulses.     Heart sounds: Normal heart sounds. No murmur heard.   Pulmonary:     Effort: Pulmonary effort is normal. No respiratory distress.     Breath sounds: Normal breath sounds. No wheezing, rhonchi or rales.  Musculoskeletal:  General: Normal range of motion.     Cervical back: Normal range of motion.     Right lower leg: No edema.     Left lower leg: No edema.     Comments: +mild swelling in Rt ankle, chronic  Skin:    General: Skin is warm and dry.     Findings: No rash.  Neurological:     General: No focal deficit present.     Mental Status: He is alert and oriented to person, place, and time.     Cranial Nerves: No cranial nerve deficit.  Psychiatric:        Mood and Affect: Mood normal.        Behavior: Behavior normal.      Assessment and Plan   1. Essential hypertension - CMP14+EGFR - Hemoglobin A1c - CBC - Lipid panel  2. Hyperlipidemia, unspecified hyperlipidemia type  3. H/O  heart artery stent  4. Benign prostatic hyperplasia with urinary frequency  5. Need for vaccination - Flu Vaccine QUAD High Dose(Fluad)  6. Anemia, unspecified type   HLD- labs ordered.  Cont meds. HTN-stable, controlled, cont meds.  Valsartan and lipitor are getting filled by cardiologist.  Lab results-call daughter's mobile number for lab results.  BPH- stable.  Per daughter not wanting to have him take medications for this, since had side effects in past that he couldn't tolerate the medications.  Feels he's doing ok and not incontinent or wetting the bed.  Pt just feeling increase in frequency that has been longstanding.  History of anemia-continue to monitor.  Stable.  Pt to f/u with cards in 6 months and f/u with our office in 1 yr or prn.  F/u 1 yr or prn.

## 2020-08-11 LAB — LIPID PANEL
Chol/HDL Ratio: 2.1 ratio (ref 0.0–5.0)
Cholesterol, Total: 109 mg/dL (ref 100–199)
HDL: 53 mg/dL (ref >39)
LDL Chol Calc (NIH): 44 mg/dL (ref 0–99)
Triglycerides: 50 mg/dL (ref 0–149)
VLDL Cholesterol Cal: 12 mg/dL (ref 5–40)

## 2020-08-11 LAB — CBC
Hematocrit: 37.5 % (ref 37.5–51.0)
Hemoglobin: 12.3 g/dL — ABNORMAL LOW (ref 13.0–17.7)
MCH: 28.3 pg (ref 26.6–33.0)
MCHC: 32.8 g/dL (ref 31.5–35.7)
MCV: 86 fL (ref 79–97)
Platelets: 146 10*3/uL — ABNORMAL LOW (ref 150–450)
RBC: 4.34 x10E6/uL (ref 4.14–5.80)
RDW: 12.9 % (ref 11.6–15.4)
WBC: 5.4 10*3/uL (ref 3.4–10.8)

## 2020-08-11 LAB — CMP14+EGFR
ALT: 24 IU/L (ref 0–44)
AST: 23 IU/L (ref 0–40)
Albumin/Globulin Ratio: 1.2 (ref 1.2–2.2)
Albumin: 3.9 g/dL (ref 3.5–4.6)
Alkaline Phosphatase: 53 IU/L (ref 44–121)
BUN/Creatinine Ratio: 12 (ref 10–24)
BUN: 17 mg/dL (ref 10–36)
Bilirubin Total: 0.8 mg/dL (ref 0.0–1.2)
CO2: 23 mmol/L (ref 20–29)
Calcium: 9.6 mg/dL (ref 8.6–10.2)
Chloride: 105 mmol/L (ref 96–106)
Creatinine, Ser: 1.46 mg/dL — ABNORMAL HIGH (ref 0.76–1.27)
GFR calc Af Amer: 47 mL/min/{1.73_m2} — ABNORMAL LOW (ref >59)
GFR calc non Af Amer: 40 mL/min/{1.73_m2} — ABNORMAL LOW (ref >59)
Globulin, Total: 3.3 g/dL (ref 1.5–4.5)
Glucose: 143 mg/dL — ABNORMAL HIGH (ref 65–99)
Potassium: 4.7 mmol/L (ref 3.5–5.2)
Sodium: 141 mmol/L (ref 134–144)
Total Protein: 7.2 g/dL (ref 6.0–8.5)

## 2020-08-11 LAB — HEMOGLOBIN A1C
Est. average glucose Bld gHb Est-mCnc: 183 mg/dL
Hgb A1c MFr Bld: 8 % — ABNORMAL HIGH (ref 4.8–5.6)

## 2020-08-15 ENCOUNTER — Telehealth: Payer: Self-pay | Admitting: *Deleted

## 2020-08-15 NOTE — Telephone Encounter (Signed)
Yes, pls send the daughter copy of the labs from her father, Czar Siguenza.   Thx.   Dr. Ladona Ridgel

## 2020-08-15 NOTE — Telephone Encounter (Signed)
Copied message from pt's daughter mychart account ( Dr. [name])   [name]  P [facility] Good morning ,  I am requesting lab results for my Roy Ibarra, Roy Ibarra, DOB 10-24-1922.  He had labs done on 08/10/2020.  I have not activated a separate My Chart account for him.  Please let me know if I need to do this.   Thank you.  Have a good day!

## 2020-08-15 NOTE — Telephone Encounter (Signed)
I sent a copy of result note to pt's daughter

## 2020-08-15 NOTE — Telephone Encounter (Signed)
See result note.  

## 2020-08-20 ENCOUNTER — Other Ambulatory Visit: Payer: Self-pay | Admitting: *Deleted

## 2020-08-20 ENCOUNTER — Telehealth: Payer: Self-pay | Admitting: Family Medicine

## 2020-08-20 DIAGNOSIS — I10 Essential (primary) hypertension: Secondary | ICD-10-CM

## 2020-08-20 DIAGNOSIS — Z131 Encounter for screening for diabetes mellitus: Secondary | ICD-10-CM

## 2020-08-20 NOTE — Telephone Encounter (Signed)
Labs ordered and pt daughter notified. States she will call us back or send a message if possible to schedule 3 mo ov.

## 2020-08-21 ENCOUNTER — Other Ambulatory Visit: Payer: Self-pay | Admitting: *Deleted

## 2020-08-21 DIAGNOSIS — I10 Essential (primary) hypertension: Secondary | ICD-10-CM

## 2020-08-21 DIAGNOSIS — Z131 Encounter for screening for diabetes mellitus: Secondary | ICD-10-CM

## 2020-09-07 ENCOUNTER — Encounter: Payer: Self-pay | Admitting: Orthopedic Surgery

## 2020-09-07 ENCOUNTER — Ambulatory Visit: Payer: 59

## 2020-09-07 ENCOUNTER — Ambulatory Visit (INDEPENDENT_AMBULATORY_CARE_PROVIDER_SITE_OTHER): Payer: 59 | Admitting: Orthopedic Surgery

## 2020-09-07 ENCOUNTER — Other Ambulatory Visit: Payer: Self-pay

## 2020-09-07 VITALS — BP 171/87 | HR 71 | Ht 71.0 in | Wt 184.0 lb

## 2020-09-07 DIAGNOSIS — M1711 Unilateral primary osteoarthritis, right knee: Secondary | ICD-10-CM

## 2020-09-07 DIAGNOSIS — M7121 Synovial cyst of popliteal space [Baker], right knee: Secondary | ICD-10-CM

## 2020-09-07 DIAGNOSIS — M25561 Pain in right knee: Secondary | ICD-10-CM

## 2020-09-07 NOTE — Progress Notes (Signed)
I changed the order for his PT to [facility] and not [facility].

## 2020-09-07 NOTE — Progress Notes (Signed)
New Patient Visit  Assessment: Roy Ibarra is a 86 y.o. male with the following: 1.  Right knee arthritis 2.  Baker's cyst   Plan: Roy Ibarra has advanced degenerative changes in his right knee, primarily within the medial compartment, as well as the patellofemoral compartment.  He also has a Baker's cyst, which is causing him some discomfort.  He will continue with Tylenol as needed, and we have placed a referral for physical therapy.  We have also given them an order for a rolling walker.  We discussed an injection, but he is not interested at this time, as his pain is not severe.  If his pain worsens in the near future, we can consider an injection.  No follow-up is needed at this time.  Follow-up: No follow-ups on file.  Subjective:  Chief Complaint  Patient presents with  . Knee Pain    right knee pain, no injury or fall,     History of Present Illness: Roy Ibarra is a 86 y.o. male who presents the pain.  He reports approximately 4 weeks of worsening right knee pain.  It is primarily painful in the back of his knee.  It is also starting to affect his right ankle.  He has been taking Tylenol for pain, with some improvement in his symptoms.  He has not been as active due to the pain.  Prior to this, he has had occasional pain in the right knee, but this is been controlled with conservative management.  He denies swelling in his knee.  No specific injury started the pain.   Review of Systems: No fevers or chills No numbness or tingling No chest pain No shortness of breath No bowel or bladder dysfunction No GI distress No headaches   Medical History:  No past medical history on file.  Past Surgical History:  Procedure Laterality Date  . INGUINAL HERNIA REPAIR    . PROSTATE BIOPSY    . STENT PLACEMENT VASCULAR ([facility] HX)  11/2017    Family History  Problem Relation Age of Onset  . Diabetes Brother   . Diabetes Brother   . Diabetes Brother    Social  History   Tobacco Use  . Smoking status: Never Smoker  . Smokeless tobacco: Never Used  Vaping Use  . Vaping Use: Never used  Substance Use Topics  . Alcohol use: Not Currently  . Drug use: Never    Allergies  Allergen Reactions  . Ace Inhibitors Swelling    Current Meds  Medication Sig  . aspirin 81 MG chewable tablet Chew by mouth daily.  Marland Kitchen atorvastatin (LIPITOR) 40 MG tablet Take 1 tablet (40 mg total) by mouth daily.  . Multiple Vitamin (MULTIVITAMIN) tablet Take 1 tablet by mouth daily.  Marland Kitchen PRESCRIPTION MEDICATION Eyes drops glaucoma  . valsartan (DIOVAN) 80 MG tablet Take 1 tablet (80 mg total) by mouth daily.    Objective: BP (!) 171/87   Pulse 71   Ht 5\' 11"  (1.803 m)   Wt 184 lb (83.5 kg)   BMI 25.66 kg/m   Physical Exam:  General: Alert and oriented, no acute distress Gait: Right sided antalgic gait, unsteady, requires assistance  Right knee No effusion Full ROM - 0-120 degrees Fullness in back of knee TTP posterior knee, medial joint line  Negative Lachman  No increased laxity to varus or valgus stress    IMAGING: I personally ordered and reviewed the following images  XR R knee with bone on bone arthritis within  the medial and patellofemoral compartments.  Osteophytes within the patellofemoral compartment  Impression:  Severe right knee arthritis, especially within the medial and patellofemoral compartments.   New Medications:  No orders of the defined types were placed in this encounter.     Oliver Barre, MD  09/07/2020 11:31 AM

## 2020-09-21 ENCOUNTER — Ambulatory Visit (HOSPITAL_COMMUNITY): Payer: 59 | Admitting: Physical Therapy

## 2020-10-31 ENCOUNTER — Telehealth: Payer: Self-pay | Admitting: Family Medicine

## 2020-10-31 DIAGNOSIS — I10 Essential (primary) hypertension: Secondary | ICD-10-CM

## 2020-10-31 DIAGNOSIS — Z79899 Other long term (current) drug therapy: Secondary | ICD-10-CM

## 2020-10-31 DIAGNOSIS — Z131 Encounter for screening for diabetes mellitus: Secondary | ICD-10-CM

## 2020-10-31 DIAGNOSIS — E785 Hyperlipidemia, unspecified: Secondary | ICD-10-CM

## 2020-10-31 NOTE — Telephone Encounter (Signed)
Ok yes, pls order. Thx. Dr. Ladona Ridgel

## 2020-10-31 NOTE — Telephone Encounter (Signed)
Patient needing labs done this week if possible

## 2020-10-31 NOTE — Telephone Encounter (Signed)
Last labs 08/2020: Lipid, CBC, HgbA1c and CMP

## 2020-11-01 NOTE — Telephone Encounter (Signed)
Daughter (DPR) notified and stated he will have blood work done Friday

## 2020-11-01 NOTE — Telephone Encounter (Signed)
Blood work ordered in Epic. Left message to return call 

## 2020-11-07 LAB — CBC WITH DIFFERENTIAL/PLATELET
Basophils Absolute: 0 10*3/uL (ref 0.0–0.2)
Basos: 0 %
EOS (ABSOLUTE): 0.2 10*3/uL (ref 0.0–0.4)
Eos: 3 %
Hematocrit: 38.8 % (ref 37.5–51.0)
Hemoglobin: 12.3 g/dL — ABNORMAL LOW (ref 13.0–17.7)
Immature Grans (Abs): 0 10*3/uL (ref 0.0–0.1)
Immature Granulocytes: 0 %
Lymphocytes Absolute: 1.4 10*3/uL (ref 0.7–3.1)
Lymphs: 27 %
MCH: 28.1 pg (ref 26.6–33.0)
MCHC: 31.7 g/dL (ref 31.5–35.7)
MCV: 89 fL (ref 79–97)
Monocytes Absolute: 0.8 10*3/uL (ref 0.1–0.9)
Monocytes: 15 %
Neutrophils Absolute: 2.8 10*3/uL (ref 1.4–7.0)
Neutrophils: 55 %
Platelets: 143 10*3/uL — ABNORMAL LOW (ref 150–450)
RBC: 4.38 x10E6/uL (ref 4.14–5.80)
RDW: 12.7 % (ref 11.6–15.4)
WBC: 5.1 10*3/uL (ref 3.4–10.8)

## 2020-11-07 LAB — HEMOGLOBIN A1C
Est. average glucose Bld gHb Est-mCnc: 169 mg/dL
Hgb A1c MFr Bld: 7.5 % — ABNORMAL HIGH (ref 4.8–5.6)

## 2020-11-07 LAB — COMPREHENSIVE METABOLIC PANEL
ALT: 28 IU/L (ref 0–44)
AST: 30 IU/L (ref 0–40)
Albumin/Globulin Ratio: 1.1 — ABNORMAL LOW (ref 1.2–2.2)
Albumin: 4 g/dL (ref 3.5–4.6)
Alkaline Phosphatase: 55 IU/L (ref 44–121)
BUN/Creatinine Ratio: 15 (ref 10–24)
BUN: 20 mg/dL (ref 10–36)
Bilirubin Total: 1 mg/dL (ref 0.0–1.2)
CO2: 23 mmol/L (ref 20–29)
Calcium: 9.5 mg/dL (ref 8.6–10.2)
Chloride: 101 mmol/L (ref 96–106)
Creatinine, Ser: 1.32 mg/dL — ABNORMAL HIGH (ref 0.76–1.27)
GFR calc Af Amer: 53 mL/min/{1.73_m2} — ABNORMAL LOW (ref >59)
GFR calc non Af Amer: 46 mL/min/{1.73_m2} — ABNORMAL LOW (ref >59)
Globulin, Total: 3.5 g/dL (ref 1.5–4.5)
Glucose: 122 mg/dL — ABNORMAL HIGH (ref 65–99)
Potassium: 4.3 mmol/L (ref 3.5–5.2)
Sodium: 136 mmol/L (ref 134–144)
Total Protein: 7.5 g/dL (ref 6.0–8.5)

## 2020-11-07 LAB — LIPID PANEL
Chol/HDL Ratio: 2 ratio (ref 0.0–5.0)
Cholesterol, Total: 101 mg/dL (ref 100–199)
HDL: 50 mg/dL (ref >39)
LDL Chol Calc (NIH): 38 mg/dL (ref 0–99)
Triglycerides: 52 mg/dL (ref 0–149)
VLDL Cholesterol Cal: 13 mg/dL (ref 5–40)

## 2020-11-09 ENCOUNTER — Other Ambulatory Visit: Payer: Self-pay

## 2020-11-09 ENCOUNTER — Encounter: Payer: Self-pay | Admitting: Family Medicine

## 2020-11-09 ENCOUNTER — Telehealth (INDEPENDENT_AMBULATORY_CARE_PROVIDER_SITE_OTHER): Payer: 59 | Admitting: Family Medicine

## 2020-11-09 DIAGNOSIS — G8929 Other chronic pain: Secondary | ICD-10-CM

## 2020-11-09 DIAGNOSIS — E119 Type 2 diabetes mellitus without complications: Secondary | ICD-10-CM | POA: Diagnosis not present

## 2020-11-09 DIAGNOSIS — I10 Essential (primary) hypertension: Secondary | ICD-10-CM | POA: Diagnosis not present

## 2020-11-09 DIAGNOSIS — M545 Low back pain, unspecified: Secondary | ICD-10-CM | POA: Diagnosis not present

## 2020-11-09 MED ORDER — ATORVASTATIN CALCIUM 40 MG PO TABS: 40.0000 mg | ORAL_TABLET | Freq: Every day | ORAL | 3 refills | Status: DC

## 2020-11-09 MED ORDER — VALSARTAN 80 MG PO TABS: 80.0000 mg | ORAL_TABLET | Freq: Every day | ORAL | 3 refills | Status: DC

## 2020-11-09 NOTE — Progress Notes (Signed)
Patient ID: Roy Ibarra, male    DOB: 06-02-1923, 87 y.o.   MRN: [id number]   Virtual Visit via Telephone Note  I connected with Roy Ibarra on 11/09/20 at  1:10 PM EST by telephone and verified that I am speaking with the correct person using two identifiers.  Location: Patient: home Provider: office   I discussed the limitations, risks, security and privacy concerns of performing an evaluation and management service by telephone and the availability of in person appointments. I also discussed with the patient that there may be a patient responsible charge related to this service. The patient expressed understanding and agreed to proceed.  Chief Complaint  Patient presents with  . Hypertension    Discuss blood work results- Needs 90 days with one refill   Subjective:    HPI Pt doing well. Phone visit with daughter.  Pt doing diet changes only.  No covid and staying healthy.  Went to eye exam 1 wk ago. Pt is going to go to [place] for 78mo.  Pt taking valsartan and lipitor and getting this from cardiologist.  Not checking bp at home. No chest pain, leg swelling, or headaches.  occ getting lightheaded but not new. Has been on 40mg  lipitor and on this since cardiac stenting.  Has low back pain and hip pain, and seeing ortho. Has h/o arthritis. They recommending tylenol.   Medical History Roy Ibarra has no past medical history on file.   Outpatient Encounter Medications as of 11/09/2020  Medication Sig  . aspirin 81 MG chewable tablet Chew by mouth daily.  01/07/2021 atorvastatin (LIPITOR) 40 MG tablet Take 1 tablet (40 mg total) by mouth daily.  . Multiple Vitamin (MULTIVITAMIN) tablet Take 1 tablet by mouth daily.  Marland Kitchen PRESCRIPTION MEDICATION Eyes drops glaucoma  . valsartan (DIOVAN) 80 MG tablet Take 1 tablet (80 mg total) by mouth daily.  . [DISCONTINUED] atorvastatin (LIPITOR) 40 MG tablet Take 1 tablet (40 mg total) by mouth daily.  . [DISCONTINUED] valsartan  (DIOVAN) 80 MG tablet Take 1 tablet (80 mg total) by mouth daily.   No facility-administered encounter medications on file as of 11/09/2020.     Review of Systems  Constitutional: Negative for chills and fever.  HENT: Negative for congestion, rhinorrhea and sore throat.   Respiratory: Negative for cough, shortness of breath and wheezing.   Cardiovascular: Negative for chest pain and leg swelling.  Gastrointestinal: Negative for abdominal pain, diarrhea, nausea and vomiting.  Genitourinary: Negative for dysuria and frequency.  Musculoskeletal: Positive for arthralgias and back pain.       +low back and hip pain-chronic   Skin: Negative for rash.  Neurological: Negative for dizziness, weakness and headaches.     Vitals There were no vitals taken for this visit.  Objective:   Physical Exam No PE due to phone visit.   Assessment and Plan   1. Diabetes mellitus type 2, diet-controlled (HCC)  2. Essential hypertension   Stable. Doing well on medications. Will cont with lipitor and diovan.  Pt to follow up after returning from 01/07/2021 in about 42mo.  F/u 16mo or prn.    Follow Up Instructions:    I discussed the assessment and treatment plan with the patient. The patient was provided an opportunity to ask questions and all were answered. The patient agreed with the plan and demonstrated an understanding of the instructions.   The patient was advised to call back or seek an in-person evaluation if the symptoms worsen or if the  condition fails to improve as anticipated.  I provided 12 minutes of non-face-to-face time during this encounter.

## 2020-11-25 NOTE — Progress Notes (Signed)
Patient ID: Roy Ibarra, male    DOB: 07/21/1923, 87 y.o.   MRN: [id number]   Virtual Visit via Telephone Note  I connected with Roy Ibarra on 11/09/20 at  1:10 PM EST by telephone and verified that I am speaking with the correct person using two identifiers.  Location: Patient: home Provider: office   I discussed the limitations, risks, security and privacy concerns of performing an evaluation and management service by telephone and the availability of in person appointments. I also discussed with the patient that there may be a patient responsible charge related to this service. The patient expressed understanding and agreed to proceed.  Chief Complaint  Patient presents with  . Hypertension    Discuss blood work results- Needs 90 days with one refill   Subjective:    HPI Pt doing well. Phone visit with daughter.  Pt doing diet changes only.  No covid and staying healthy.  Went to eye exam 1 wk ago. Pt is going to go to [place] for 74mo.  Pt taking valsartan and lipitor and getting this from cardiologist.  Not checking bp at home. No chest pain, leg swelling, or headaches.  occ getting lightheaded but not new. Has been on 40mg  lipitor and on this since cardiac stenting.  Has low back pain and hip pain, and seeing ortho. Has h/o arthritis. They recommending tylenol.   Medical History [place] has no past medical history on file.   Outpatient Encounter Medications as of 11/09/2020  Medication Sig  . aspirin 81 MG chewable tablet Chew by mouth daily.  01/07/2021 atorvastatin (LIPITOR) 40 MG tablet Take 1 tablet (40 mg total) by mouth daily.  . Multiple Vitamin (MULTIVITAMIN) tablet Take 1 tablet by mouth daily.  Marland Kitchen PRESCRIPTION MEDICATION Eyes drops glaucoma  . valsartan (DIOVAN) 80 MG tablet Take 1 tablet (80 mg total) by mouth daily.  . [DISCONTINUED] atorvastatin (LIPITOR) 40 MG tablet Take 1 tablet (40 mg total) by mouth daily.  . [DISCONTINUED] valsartan  (DIOVAN) 80 MG tablet Take 1 tablet (80 mg total) by mouth daily.   No facility-administered encounter medications on file as of 11/09/2020.     Review of Systems  Constitutional: Negative for chills and fever.  HENT: Negative for congestion, rhinorrhea and sore throat.   Respiratory: Negative for cough, shortness of breath and wheezing.   Cardiovascular: Negative for chest pain and leg swelling.  Gastrointestinal: Negative for abdominal pain, diarrhea, nausea and vomiting.  Genitourinary: Negative for dysuria and frequency.  Musculoskeletal: Positive for arthralgias and back pain.       +low back and hip pain-chronic   Skin: Negative for rash.  Neurological: Negative for dizziness, weakness and headaches.     Vitals There were no vitals taken for this visit.  Objective:   Physical Exam No PE due to phone visit.   Assessment and Plan    1. Diabetes mellitus type 2, diet-controlled (HCC)  2. Essential hypertension  3. Chronic low back pain without sciatica, unspecified back pain laterality   DM2- a1c at 7.5, improved compared to last time at 8.0, has been doing diet modifications. Stable. Cont with diet modification.  htn- stable. Will cont with diovan.  HLD- stable. Labs reviewed and at goal. Cont lipitor.  On next visit may consider decreasing from 40mg  to 20mg  lipitor.  ckd - slight elevated Cr at 1.3, improved from Nov at 1.46. will cont to monitor.  Chronic low back pain and hip pain- seeing ortho for this.  Cont  with tylenol prn.  Pt to follow up after returning from [place] in about 46mo.  F/u 63mo or prn.    Follow Up Instructions:    I discussed the assessment and treatment plan with the patient. The patient was provided an opportunity to ask questions and all were answered. The patient agreed with the plan and demonstrated an understanding of the instructions.   The patient was advised to call back or seek an in-person evaluation if the symptoms  worsen or if the condition fails to improve as anticipated.  I provided 12 minutes of non-face-to-face time during this encounter.

## 2020-12-14 ENCOUNTER — Ambulatory Visit: Payer: 59 | Admitting: Family Medicine

## 2021-05-31 ENCOUNTER — Ambulatory Visit (INDEPENDENT_AMBULATORY_CARE_PROVIDER_SITE_OTHER): Payer: 59 | Admitting: Family Medicine

## 2021-05-31 ENCOUNTER — Other Ambulatory Visit: Payer: Self-pay

## 2021-05-31 ENCOUNTER — Encounter: Payer: Self-pay | Admitting: Family Medicine

## 2021-05-31 VITALS — BP 199/92 | HR 61 | Temp 97.3°F | Ht 71.0 in | Wt 173.4 lb

## 2021-05-31 DIAGNOSIS — Z7689 Persons encountering health services in other specified circumstances: Secondary | ICD-10-CM

## 2021-05-31 DIAGNOSIS — E1122 Type 2 diabetes mellitus with diabetic chronic kidney disease: Secondary | ICD-10-CM | POA: Diagnosis not present

## 2021-05-31 DIAGNOSIS — M545 Low back pain, unspecified: Secondary | ICD-10-CM

## 2021-05-31 DIAGNOSIS — E1169 Type 2 diabetes mellitus with other specified complication: Secondary | ICD-10-CM

## 2021-05-31 DIAGNOSIS — E785 Hyperlipidemia, unspecified: Secondary | ICD-10-CM

## 2021-05-31 DIAGNOSIS — Z955 Presence of coronary angioplasty implant and graft: Secondary | ICD-10-CM

## 2021-05-31 DIAGNOSIS — R2689 Other abnormalities of gait and mobility: Secondary | ICD-10-CM

## 2021-05-31 DIAGNOSIS — E1159 Type 2 diabetes mellitus with other circulatory complications: Secondary | ICD-10-CM | POA: Diagnosis not present

## 2021-05-31 DIAGNOSIS — D696 Thrombocytopenia, unspecified: Secondary | ICD-10-CM

## 2021-05-31 DIAGNOSIS — I152 Hypertension secondary to endocrine disorders: Secondary | ICD-10-CM

## 2021-05-31 DIAGNOSIS — G8929 Other chronic pain: Secondary | ICD-10-CM

## 2021-05-31 DIAGNOSIS — R5381 Other malaise: Secondary | ICD-10-CM

## 2021-05-31 DIAGNOSIS — N1831 Chronic kidney disease, stage 3a: Secondary | ICD-10-CM

## 2021-05-31 LAB — BAYER DCA HB A1C WAIVED: HB A1C (BAYER DCA - WAIVED): 6.2 % (ref <7.0)

## 2021-05-31 MED ORDER — AMLODIPINE BESYLATE 2.5 MG PO TABS: 2.5000 mg | ORAL_TABLET | Freq: Every day | ORAL | 3 refills | Status: DC

## 2021-05-31 MED ORDER — ROSUVASTATIN CALCIUM 10 MG PO TABS: 10.0000 mg | ORAL_TABLET | Freq: Every day | ORAL | 3 refills | Status: DC

## 2021-05-31 NOTE — Progress Notes (Signed)
Subjective: IF:OYDXAJOIN care, T2DM ,HLD, HTN HPI: Roy Ibarra is a 87 y.o. male presenting to clinic today for:  1. Type 2 Diabetes with hypertension, hyperlipidemia associated with CKD3a; impairment in physical mobility; chronic back pain:  No history of DKA, pancreatitis, diabetic foot ulcer.  No known diabetic retinopathy.  He has had diet-controlled diabetes with last A1c within acceptable range for age at 7.5.  He has been compliant with Lipitor 40 mg daily but his daughter accompanies him today and asked that we consider switching him over to Crestor at a lower dose given very tightly controlled LDL and advanced age.  He has struggled with his blood pressure for quite some time.  He has tried higher doses of Diovan but had some dizziness with it.  Blood pressures on average are well above 160 on the top.  No reports of chest pain.  He has chronic visual impairment secondary to glaucoma and is treated with Xalatan and Cosopt for this.  He also has hearing impairment.  Plan for hearing aids soon.  She does report a decline in his stability and ambulatory status since this time last year.  Previously he was able to walk a block on his own but now he has issues with activity.  He has some low back pain and knee pain which impacts his ability to ambulate.  He does have a cane and walker at home but he does not utilize these regularly.  He is under the care of orthopedics in [place].  She will be establishing him with a new cardiologist in [place] soon as well.  Last eye exam: Sees Dr. B Last foot exam: Needs Last A1c:  Lab Results  Component Value Date   HGBA1C 7.5 (H) 11/06/2020   Nephropathy screen indicated?:  On ARB Last flu, zoster and/or pneumovax:  Immunization History  Administered Date(s) Administered   DTaP 07/29/2016   Fluad Quad(high Dose 65+) 07/20/2020   Influenza,inj,Quad PF,6+ Mos 08/22/2018, 07/10/2019   Influenza,trivalent, recombinat, inj, PF 08/06/2015,  08/07/2017   Influenza-Unspecified 08/22/2016   Moderna Sars-Covid-2 Vaccination 11/12/2019, 12/06/2019      History reviewed. No pertinent past medical history. Past Surgical History:  Procedure Laterality Date   INGUINAL HERNIA REPAIR     PROSTATE BIOPSY     STENT PLACEMENT VASCULAR ([facility] HX)  11/2017   Social History   Socioeconomic History   Marital status: Widowed    Spouse name: Not on file   Number of children: Not on file   Years of education: Not on file   Highest education level: Not on file  Occupational History   Not on file  Tobacco Use   Smoking status: Never   Smokeless tobacco: Never  Vaping Use   Vaping Use: Never used  Substance and Sexual Activity   Alcohol use: Not Currently   Drug use: Never   Sexual activity: Not Currently  Other Topics Concern   Not on file  Social History Narrative   Not on file   Social Determinants of Health   Financial Resource Strain: Not on file  Food Insecurity: Not on file  Transportation Needs: Not on file  Physical Activity: Not on file  Stress: Not on file  Social Connections: Not on file  Intimate Partner Violence: Not on file   Current Meds  Medication Sig   aspirin 81 MG chewable tablet Chew by mouth daily.   atorvastatin (LIPITOR) 40 MG tablet Take 1 tablet (40 mg total) by mouth daily.  Multiple Vitamin (MULTIVITAMIN) tablet Take 1 tablet by mouth daily.   PRESCRIPTION MEDICATION Eyes drops glaucoma   valsartan (DIOVAN) 80 MG tablet Take 1 tablet (80 mg total) by mouth daily.   Family History  Problem Relation Age of Onset   Diabetes Brother    Diabetes Brother    Diabetes Brother    Allergies  Allergen Reactions   Ace Inhibitors Swelling     ROS: Per HPI  Objective: Office vital signs reviewed. BP (!) 199/92   Pulse 61   Temp (!) 97.3 F (36.3 C)   Ht 5\' 11"  (1.803 m)   Wt 173 lb 6.4 oz (78.7 kg)   SpO2 100%   BMI 24.18 kg/m   Physical Examination:  General: Awake, alert, well  nourished, No acute distress HEENT: No conjunctival injection.  Mucous membranes are moist. Cardio: regular rate and rhythm, S1S2 heard, no murmurs appreciated Pulm: clear to auscultation bilaterally, no wheezes, rhonchi or rales; normal work of breathing on room air Extremities: warm, well perfused, No edema, cyanosis or clubbing; +2 pulses bilaterally MSK: Slow and slightly unsteady gait and mildly hunched station Skin: Onychomycotic changes noted between the toes bilaterally Neuro: See diabetic foot exam; hard of hearing.  Diabetic Foot Exam - Simple   Simple Foot Form Diabetic Foot exam was performed with the following findings: Yes 05/31/2021  2:54 PM  Visual Inspection No deformities, no ulcerations, no other skin breakdown bilaterally: Yes Sensation Testing Intact to touch and monofilament testing bilaterally: Yes Pulse Check Posterior Tibialis and Dorsalis pulse intact bilaterally: Yes Comments Decreased vibratory sensation.     Assessment/ Plan: 87 y.o. male   Type 2 diabetes mellitus with stage 3a chronic kidney disease, without long-term current use of insulin (HCC) - Plan: Renal Function Panel, Bayer DCA Hb A1c Waived, CBC, VITAMIN D 25 Hydroxy (Vit-D Deficiency, Fractures)  Hypertension associated with diabetes (HCC) - Plan: Renal Function Panel, amLODipine (NORVASC) 2.5 MG tablet  Hyperlipidemia associated with type 2 diabetes mellitus (HCC) - Plan: rosuvastatin (CRESTOR) 10 MG tablet  H/O heart artery stent  Establishing care with new doctor, encounter for  Thrombocytopenia (HCC) - Plan: CBC  Balance problem - Plan: Ambulatory referral to Home Health  Physical deconditioning - Plan: Ambulatory referral to Home Health  Chronic low back pain without sciatica, unspecified back pain laterality - Plan: Ambulatory referral to Home Health  Check A1c.  I agree that keeping his A1c somewhere between 7 and 7.9 is acceptable given his advanced age.  He has thus far been  diet controlled.  Check vitamin D, CBC given renal impairment.  Blood pressure not at goal despite use of valsartan.  It sounds like he may have had some orthostatic changes with advanced doses of valsartan so we will start with a small dose of Norvasc but anticipate we will need to increase this at some point.  His daughter is very dependable and can monitor his blood pressures closely at home.  We will anticipate reconvene in the next 2 to 4 weeks for blood pressure review and advancement of antihypertensives as appropriate  Agree with reestablishing care with cardiology given history of coronary stent  Thrombocytopenia noted on last lab check.  Check CBC  Face-to-face visit was completed for home health referral for physical therapy.  He certainly had some instability but was able to ambulate independently.  I think that he is likely physically deconditioned in the setting of immobility due to arthritic changes in knees and back.  I would  like him to have a physical therapist work with him on strengthening, balance training and improving back pain.  Raliegh Ip, DO [facility] [phone]

## 2021-05-31 NOTE — Patient Instructions (Signed)
New meds:   Norvasc 2.5mg  daily.  Goal BP <150/90.  Crestor 10mg  nightly.

## 2021-06-01 LAB — RENAL FUNCTION PANEL
Albumin: 4.3 g/dL (ref 3.5–4.6)
BUN/Creatinine Ratio: 18 (ref 10–24)
BUN: 23 mg/dL (ref 10–36)
CO2: 22 mmol/L (ref 20–29)
Calcium: 9.7 mg/dL (ref 8.6–10.2)
Chloride: 101 mmol/L (ref 96–106)
Creatinine, Ser: 1.25 mg/dL (ref 0.76–1.27)
Glucose: 93 mg/dL (ref 65–99)
Phosphorus: 4.2 mg/dL — ABNORMAL HIGH (ref 2.8–4.1)
Potassium: 5.3 mmol/L — ABNORMAL HIGH (ref 3.5–5.2)
Sodium: 137 mmol/L (ref 134–144)
eGFR: 53 mL/min/{1.73_m2} — ABNORMAL LOW (ref >59)

## 2021-06-01 LAB — CBC
Hematocrit: 40.1 % (ref 37.5–51.0)
Hemoglobin: 12.9 g/dL — ABNORMAL LOW (ref 13.0–17.7)
MCH: 28.3 pg (ref 26.6–33.0)
MCHC: 32.2 g/dL (ref 31.5–35.7)
MCV: 88 fL (ref 79–97)
Platelets: 173 10*3/uL (ref 150–450)
RBC: 4.56 x10E6/uL (ref 4.14–5.80)
RDW: 12.8 % (ref 11.6–15.4)
WBC: 7 10*3/uL (ref 3.4–10.8)

## 2021-06-01 LAB — VITAMIN D 25 HYDROXY (VIT D DEFICIENCY, FRACTURES): Vit D, 25-Hydroxy: 39 ng/mL (ref 30.0–100.0)

## 2021-07-01 ENCOUNTER — Ambulatory Visit: Payer: 59 | Admitting: Family Medicine

## 2021-07-03 ENCOUNTER — Other Ambulatory Visit: Payer: Self-pay

## 2021-07-03 ENCOUNTER — Ambulatory Visit (INDEPENDENT_AMBULATORY_CARE_PROVIDER_SITE_OTHER): Payer: 59

## 2021-07-03 DIAGNOSIS — H9190 Unspecified hearing loss, unspecified ear: Secondary | ICD-10-CM

## 2021-07-03 DIAGNOSIS — N1831 Chronic kidney disease, stage 3a: Secondary | ICD-10-CM

## 2021-07-03 DIAGNOSIS — G8929 Other chronic pain: Secondary | ICD-10-CM | POA: Diagnosis not present

## 2021-07-03 DIAGNOSIS — E785 Hyperlipidemia, unspecified: Secondary | ICD-10-CM

## 2021-07-03 DIAGNOSIS — D696 Thrombocytopenia, unspecified: Secondary | ICD-10-CM

## 2021-07-03 DIAGNOSIS — E1159 Type 2 diabetes mellitus with other circulatory complications: Secondary | ICD-10-CM

## 2021-07-03 DIAGNOSIS — I152 Hypertension secondary to endocrine disorders: Secondary | ICD-10-CM | POA: Diagnosis not present

## 2021-07-03 DIAGNOSIS — M419 Scoliosis, unspecified: Secondary | ICD-10-CM

## 2021-07-03 DIAGNOSIS — Z955 Presence of coronary angioplasty implant and graft: Secondary | ICD-10-CM

## 2021-07-03 DIAGNOSIS — M545 Low back pain, unspecified: Secondary | ICD-10-CM

## 2021-07-03 DIAGNOSIS — E1122 Type 2 diabetes mellitus with diabetic chronic kidney disease: Secondary | ICD-10-CM

## 2021-07-03 DIAGNOSIS — H409 Unspecified glaucoma: Secondary | ICD-10-CM

## 2021-07-03 DIAGNOSIS — E1169 Type 2 diabetes mellitus with other specified complication: Secondary | ICD-10-CM

## 2021-07-08 ENCOUNTER — Ambulatory Visit: Payer: 59 | Admitting: Physician Assistant

## 2021-07-19 ENCOUNTER — Encounter: Payer: Self-pay | Admitting: *Deleted

## 2021-07-22 ENCOUNTER — Ambulatory Visit: Payer: 59 | Admitting: Family Medicine

## 2021-07-22 ENCOUNTER — Encounter: Payer: Self-pay | Admitting: Cardiology

## 2021-07-22 ENCOUNTER — Ambulatory Visit (INDEPENDENT_AMBULATORY_CARE_PROVIDER_SITE_OTHER): Payer: 59 | Admitting: Cardiology

## 2021-07-22 ENCOUNTER — Other Ambulatory Visit: Payer: Self-pay

## 2021-07-22 VITALS — BP 140/68 | HR 59 | Ht 71.0 in | Wt 178.8 lb

## 2021-07-22 DIAGNOSIS — I10 Essential (primary) hypertension: Secondary | ICD-10-CM

## 2021-07-22 DIAGNOSIS — I25118 Atherosclerotic heart disease of native coronary artery with other forms of angina pectoris: Secondary | ICD-10-CM | POA: Diagnosis not present

## 2021-07-22 DIAGNOSIS — E782 Mixed hyperlipidemia: Secondary | ICD-10-CM

## 2021-07-22 NOTE — Progress Notes (Signed)
Clinical Summary Roy Ibarra is a 87 y.o.male seen today for follow up of the following medical problems.   1.CAD - history of DES to LAD in 11/2017. Had 50% LAD disease beyond first septal perforator managed medically, no significant RCA or LCX disease - Echocardiogram on 11/19/2017 demonstrated mildly reduced left ventricular systolic function, LVEF 45 to 11%, mild concentric LVH, normal diastolic function for age, with "severe posteroinferior hypokinesis".    02/2020 echo LVEF 55-60%, no WMAs, grade II dd, low normal RV function, mild MR.   - no recent chest pain. Chronic SOB unchanged - compliant with meds  2.HTN - from notes have accepted higher range given advanced age, dizzienss - previously had lightheadedness on valsartan.  - pcp recently started norvasc 2.5mg  daily  - home sbps 150s  3. Hyperlipidemia - he is on crestor 10mg  daily - 11/2020 TC 101 TG 52 HDL 50 LDL 38 (was on atorva 40mg  at that time)     Soc Hx: His wife died in Feb 06, 2018. He is a retired . His daughter is Dr. May 2019. Past Medical History:  Diagnosis Date   CAD (coronary artery disease)    CKD (chronic kidney disease), stage III (HCC)    Hyperlipidemia    Hypertension    Ischemic cardiomyopathy      Allergies  Allergen Reactions   Ace Inhibitors Swelling     Current Outpatient Medications  Medication Sig Dispense Refill   amLODipine (NORVASC) 2.5 MG tablet Take 1 tablet (2.5 mg total) by mouth daily. 90 tablet 3   aspirin 81 MG chewable tablet Chew by mouth daily.     dorzolamide-timolol (COSOPT) 22.3-6.8 MG/ML ophthalmic solution 1 drop 2 (two) times daily.     latanoprost (XALATAN) 0.005 % ophthalmic solution 1 drop at bedtime.     Multiple Vitamin (MULTIVITAMIN) tablet Take 1 tablet by mouth daily.     PRESCRIPTION MEDICATION Eyes drops glaucoma     rosuvastatin (CRESTOR) 10 MG tablet Take 1 tablet (10 mg total) by mouth daily. 90 tablet 3   valsartan  (DIOVAN) 80 MG tablet Take 1 tablet (80 mg total) by mouth daily. 90 tablet 3   No current facility-administered medications for this visit.     Past Surgical History:  Procedure Laterality Date   INGUINAL HERNIA REPAIR     PROSTATE BIOPSY     STENT PLACEMENT VASCULAR ([facility] HX)  11/2017     Allergies  Allergen Reactions   Ace Inhibitors Swelling      Family History  Problem Relation Age of Onset   Diabetes Brother    Diabetes Brother    Diabetes Brother      Social History Roy Ibarra reports that he has never smoked. He has never used smokeless tobacco. Roy Ibarra reports that he does not currently use alcohol.   Review of Systems CONSTITUTIONAL: No weight loss, fever, chills, weakness or fatigue.  HEENT: Eyes: No visual loss, blurred vision, double vision or yellow sclerae.No hearing loss, sneezing, congestion, runny nose or sore throat.  SKIN: No rash or itching.  CARDIOVASCULAR: per hpi RESPIRATORY: No shortness of breath, cough or sputum.  GASTROINTESTINAL: No anorexia, nausea, vomiting or diarrhea. No abdominal pain or blood.  GENITOURINARY: No burning on urination, no polyuria NEUROLOGICAL: No headache, dizziness, syncope, paralysis, ataxia, numbness or tingling in the extremities. No change in bowel or bladder control.  MUSCULOSKELETAL: No muscle, back pain, joint pain or stiffness.  LYMPHATICS: No enlarged nodes. No history  of splenectomy.  PSYCHIATRIC: No history of depression or anxiety.  ENDOCRINOLOGIC: No reports of sweating, cold or heat intolerance. No polyuria or polydipsia.  Marland Kitchen   Physical Examination Today's Vitals   07/22/21 1430  BP: 140/68  Pulse: (!) 59  SpO2: 98%  Weight: 178 lb 12.8 oz (81.1 kg)  Height: 5\' 11"  (1.803 m)   Body mass index is 24.94 kg/m.  Gen: resting comfortably, no acute distress HEENT: no scleral icterus, pupils equal round and reactive, no palptable cervical adenopathy,  CV: RRR, no mr/g no jvd Resp: Clear to  auscultation bilaterally GI: abdomen is soft, non-tender, non-distended, normal bowel sounds, no hepatosplenomegaly MSK: extremities are warm, no edema.  Skin: warm, no rash Neuro:  no focal deficits Psych: appropriate affect      Assessment and Plan  CAD - no recent symptmos, continue current meds EKG today shows SR, chronic ST/T changes  2. HTN - reasonable control given advanced age and prior dizziness, continue current therapy  3. Hyperlipidemia - at goal, continue current meds      , M.D.

## 2021-07-22 NOTE — Patient Instructions (Signed)
Medication Instructions:  Your physician recommends that you continue on your current medications as directed. Please refer to the Current Medication list given to you today.  *If you need a refill on your cardiac medications before your next appointment, please call your pharmacy*   Lab Work: None today If you have labs (blood work) drawn today and your tests are completely normal, you will receive your results only by: . MyChart Message (if you have MyChart) OR . A paper copy in the mail If you have any lab test that is abnormal or we need to change your treatment, we will call you to review the results.   Testing/Procedures: None today   Follow-Up: At [facility], you and your health needs are our priority.  As part of our continuing mission to provide you with exceptional heart care, we have created designated Provider Care Teams.  These Care Teams include your primary Cardiologist (physician) and Advanced Practice Providers (APPs -  Physician Assistants and Nurse Practitioners) who all work together to provide you with the care you need, when you need it.  We recommend signing up for the patient portal called "MyChart".  Sign up information is provided on this After Visit Summary.  MyChart is used to connect with patients for Virtual Visits (Telemedicine).  Patients are able to view lab/test results, encounter notes, upcoming appointments, etc.  Non-urgent messages can be sent to your provider as well.   To learn more about what you can do with MyChart, go to [email or web address].    Your next appointment:   6 month(s)  The format for your next appointment:   In Person  Provider:   Jonathan Branch, MD   Other Instructions None       Thank you for choosing [facility] !         

## 2021-11-04 ENCOUNTER — Encounter: Payer: Self-pay | Admitting: Family Medicine

## 2021-11-04 ENCOUNTER — Ambulatory Visit (INDEPENDENT_AMBULATORY_CARE_PROVIDER_SITE_OTHER): Payer: 59 | Admitting: Family Medicine

## 2021-11-04 VITALS — BP 190/100 | HR 60 | Temp 97.6°F | Ht 71.0 in | Wt 187.0 lb

## 2021-11-04 DIAGNOSIS — E785 Hyperlipidemia, unspecified: Secondary | ICD-10-CM

## 2021-11-04 DIAGNOSIS — E1159 Type 2 diabetes mellitus with other circulatory complications: Secondary | ICD-10-CM

## 2021-11-04 DIAGNOSIS — E1169 Type 2 diabetes mellitus with other specified complication: Secondary | ICD-10-CM

## 2021-11-04 DIAGNOSIS — Z955 Presence of coronary angioplasty implant and graft: Secondary | ICD-10-CM | POA: Diagnosis not present

## 2021-11-04 DIAGNOSIS — E1122 Type 2 diabetes mellitus with diabetic chronic kidney disease: Secondary | ICD-10-CM | POA: Diagnosis not present

## 2021-11-04 DIAGNOSIS — N1831 Chronic kidney disease, stage 3a: Secondary | ICD-10-CM

## 2021-11-04 DIAGNOSIS — I152 Hypertension secondary to endocrine disorders: Secondary | ICD-10-CM

## 2021-11-04 LAB — BAYER DCA HB A1C WAIVED: HB A1C (BAYER DCA - WAIVED): 9 % — ABNORMAL HIGH (ref 4.8–5.6)

## 2021-11-04 MED ORDER — BLOOD GLUCOSE METER KIT: PACK | 0 refills | Status: AC

## 2021-11-04 MED ORDER — GLUCOSE BLOOD VI STRP: ORAL_STRIP | 12 refills | Status: AC

## 2021-11-04 MED ORDER — LANCET DEVICE MISC: 12 refills | Status: AC

## 2021-11-04 NOTE — Progress Notes (Signed)
Subjective: CC:DM PCP: Janora Norlander, DO KGO:[id number] Roy Ibarra is a 88 y.o. male presenting to clinic today for:  1. Type 2 Diabetes with hypertension, hyperlipidemia:  Not currently treated with any medications.  He has not really been following a strict diet since he got back from [place].  They are not measuring his blood pressures or blood sugars at home routinely.  He had normal blood pressure when he saw his cardiologist in October.  He does report some fatigability and increased urination.  Last eye exam: UTD Last foot exam: UTD Last A1c:  Lab Results  Component Value Date   HGBA1C 6.2 05/31/2021   Nephropathy screen indicated?: on ARB Last flu, zoster and/or pneumovax:  Immunization History  Administered Date(s) Administered   DTaP 07/29/2016   Fluad Quad(high Dose 65+) 07/20/2020   Influenza,inj,Quad PF,6+ Mos 08/22/2018, 07/10/2019   Influenza,trivalent, recombinat, inj, PF 08/06/2015, 08/07/2017   Influenza-Unspecified 08/22/2016   Moderna Sars-Covid-2 Vaccination 11/12/2019, 12/06/2019    ROS: No chest pain, shortness of breath.  ROS: Per HPI  Allergies  Allergen Reactions   Ace Inhibitors Swelling   Past Medical History:  Diagnosis Date   CAD (coronary artery disease)    CKD (chronic kidney disease), stage III (HCC)    Hyperlipidemia    Hypertension    Ischemic cardiomyopathy     Current Outpatient Medications:    amLODipine (NORVASC) 2.5 MG tablet, Take 1 tablet (2.5 mg total) by mouth daily., Disp: 90 tablet, Rfl: 3   aspirin 81 MG chewable tablet, Chew by mouth daily., Disp: , Rfl:    dorzolamide-timolol (COSOPT) 22.3-6.8 MG/ML ophthalmic solution, 1 drop 2 (two) times daily., Disp: , Rfl:    latanoprost (XALATAN) 0.005 % ophthalmic solution, 1 drop at bedtime., Disp: , Rfl:    Multiple Vitamin (MULTIVITAMIN) tablet, Take 1 tablet by mouth daily., Disp: , Rfl:    rosuvastatin (CRESTOR) 10 MG tablet, Take 1 tablet (10 mg total) by mouth  daily., Disp: 90 tablet, Rfl: 3   valsartan (DIOVAN) 80 MG tablet, Take 1 tablet (80 mg total) by mouth daily., Disp: 90 tablet, Rfl: 3 Social History   Socioeconomic History   Marital status: Widowed    Spouse name: Not on file   Number of children: Not on file   Years of education: Not on file   Highest education level: Not on file  Occupational History   Not on file  Tobacco Use   Smoking status: Never   Smokeless tobacco: Never  Vaping Use   Vaping Use: Never used  Substance and Sexual Activity   Alcohol use: Not Currently   Drug use: Never   Sexual activity: Not Currently  Other Topics Concern   Not on file  Social History Narrative   Not on file   Social Determinants of Health   Financial Resource Strain: Not on file  Food Insecurity: Not on file  Transportation Needs: Not on file  Physical Activity: Not on file  Stress: Not on file  Social Connections: Not on file  Intimate Partner Violence: Not on file   Family History  Problem Relation Age of Onset   Diabetes Brother    Diabetes Brother    Diabetes Brother     Objective: Office vital signs reviewed. BP (!) 198/87    Pulse 60    Temp 97.6 F (36.4 C)    Ht '5\' 11"'  (1.803 m)    Wt 187 lb (84.8 kg)    SpO2 99%  BMI 26.08 kg/m   Physical Examination:  General: Awake, alert, well-appearing elderly male, No acute distress HEENT: Moist mucous membranes Cardio: regular rate and rhythm, S1S2 heard, no murmurs appreciated Pulm: clear to auscultation bilaterally, no wheezes, rhonchi or rales; normal work of breathing on room air MSK: Ambulating independently  Assessment/ Plan: 88 y.o. male   Type 2 diabetes mellitus with stage 3a chronic kidney disease, without long-term current use of insulin ([place]) - Plan: Bayer DCA Hb A1c Waived, CMP14+EGFR, Lipid Panel, blood glucose meter kit and supplies, Lancet Device MISC, glucose blood test strip  Hypertension associated with diabetes ([place])  Hyperlipidemia  associated with type 2 diabetes mellitus ([place])  H/O heart artery stent  Sugars uncontrolled A1c rising to 9.0.  Apparently there is been a lot of dietary indiscretions.  He is accompanied today by his daughter who will make sure that he is eating very low-carb until his next visit.  We are trying to avoid additional medication.  Permissive A1c to 7.9 given age greater than 42 but would really like around 7.5.  Blood pressure not at goal.  Somewhat confounding as in October his blood pressure was controlled at his cardiologist.  May be secondary to inappropriate diet.  His daughter again will monitor blood pressures very closely at home and contact me if he has persistent blood pressures above 150 over 90s.  We will need to consider advancing Norvasc to 5 mg daily if that is the case  Continue statin.  Nonfasting lipid ordered     Orders Placed This Encounter  Procedures   Bayer DCA Hb A1c Waived   CMP14+EGFR   Lipid Panel   No orders of the defined types were placed in this encounter.    Janora Norlander, DO [place] [phone]

## 2021-11-05 LAB — LIPID PANEL
Chol/HDL Ratio: 2.3 ratio (ref 0.0–5.0)
Cholesterol, Total: 104 mg/dL (ref 100–199)
HDL: 46 mg/dL (ref >39)
LDL Chol Calc (NIH): 40 mg/dL (ref 0–99)
Triglycerides: 91 mg/dL (ref 0–149)
VLDL Cholesterol Cal: 18 mg/dL (ref 5–40)

## 2021-11-05 LAB — CMP14+EGFR
ALT: 31 IU/L (ref 0–44)
AST: 33 IU/L (ref 0–40)
Albumin/Globulin Ratio: 1.1 — ABNORMAL LOW (ref 1.2–2.2)
Albumin: 4.1 g/dL (ref 3.5–4.6)
Alkaline Phosphatase: 59 IU/L (ref 44–121)
BUN/Creatinine Ratio: 13 (ref 10–24)
BUN: 20 mg/dL (ref 10–36)
Bilirubin Total: 0.5 mg/dL (ref 0.0–1.2)
CO2: 23 mmol/L (ref 20–29)
Calcium: 9.5 mg/dL (ref 8.6–10.2)
Chloride: 101 mmol/L (ref 96–106)
Creatinine, Ser: 1.49 mg/dL — ABNORMAL HIGH (ref 0.76–1.27)
Globulin, Total: 3.7 g/dL (ref 1.5–4.5)
Glucose: 170 mg/dL — ABNORMAL HIGH (ref 70–99)
Potassium: 5.3 mmol/L — ABNORMAL HIGH (ref 3.5–5.2)
Sodium: 137 mmol/L (ref 134–144)
Total Protein: 7.8 g/dL (ref 6.0–8.5)
eGFR: 43 mL/min/{1.73_m2} — ABNORMAL LOW (ref >59)

## 2021-11-05 NOTE — Progress Notes (Signed)
Pt's sister returning call. Please call back.

## 2021-12-07 ENCOUNTER — Encounter (HOSPITAL_COMMUNITY): Payer: Self-pay

## 2021-12-07 ENCOUNTER — Inpatient Hospital Stay (HOSPITAL_COMMUNITY): Payer: 59

## 2021-12-07 ENCOUNTER — Other Ambulatory Visit: Payer: Self-pay

## 2021-12-07 ENCOUNTER — Emergency Department (HOSPITAL_COMMUNITY): Payer: 59

## 2021-12-07 ENCOUNTER — Inpatient Hospital Stay (HOSPITAL_COMMUNITY)
Admission: EM | Admit: 2021-12-07 | Discharge: 2021-12-09 | DRG: 291 | Disposition: A | Discharge status: Home Health | Payer: 59 | Attending: Internal Medicine | Admitting: Internal Medicine

## 2021-12-07 DIAGNOSIS — I152 Hypertension secondary to endocrine disorders: Secondary | ICD-10-CM | POA: Diagnosis present

## 2021-12-07 DIAGNOSIS — Z7982 Long term (current) use of aspirin: Secondary | ICD-10-CM

## 2021-12-07 DIAGNOSIS — I509 Heart failure, unspecified: Secondary | ICD-10-CM

## 2021-12-07 DIAGNOSIS — Z833 Family history of diabetes mellitus: Secondary | ICD-10-CM | POA: Diagnosis not present

## 2021-12-07 DIAGNOSIS — E1159 Type 2 diabetes mellitus with other circulatory complications: Secondary | ICD-10-CM | POA: Diagnosis present

## 2021-12-07 DIAGNOSIS — E1151 Type 2 diabetes mellitus with diabetic peripheral angiopathy without gangrene: Secondary | ICD-10-CM

## 2021-12-07 DIAGNOSIS — E119 Type 2 diabetes mellitus without complications: Secondary | ICD-10-CM

## 2021-12-07 DIAGNOSIS — E1169 Type 2 diabetes mellitus with other specified complication: Secondary | ICD-10-CM | POA: Diagnosis present

## 2021-12-07 DIAGNOSIS — R509 Fever, unspecified: Secondary | ICD-10-CM | POA: Diagnosis not present

## 2021-12-07 DIAGNOSIS — E1165 Type 2 diabetes mellitus with hyperglycemia: Secondary | ICD-10-CM | POA: Diagnosis present

## 2021-12-07 DIAGNOSIS — Z79899 Other long term (current) drug therapy: Secondary | ICD-10-CM | POA: Diagnosis not present

## 2021-12-07 DIAGNOSIS — I5031 Acute diastolic (congestive) heart failure: Secondary | ICD-10-CM | POA: Diagnosis present

## 2021-12-07 DIAGNOSIS — R531 Weakness: Secondary | ICD-10-CM | POA: Diagnosis not present

## 2021-12-07 DIAGNOSIS — I130 Hypertensive heart and chronic kidney disease with heart failure and stage 1 through stage 4 chronic kidney disease, or unspecified chronic kidney disease: Secondary | ICD-10-CM | POA: Diagnosis not present

## 2021-12-07 DIAGNOSIS — R0989 Other specified symptoms and signs involving the circulatory and respiratory systems: Secondary | ICD-10-CM

## 2021-12-07 DIAGNOSIS — Z20822 Contact with and (suspected) exposure to covid-19: Secondary | ICD-10-CM | POA: Diagnosis present

## 2021-12-07 DIAGNOSIS — E1122 Type 2 diabetes mellitus with diabetic chronic kidney disease: Secondary | ICD-10-CM | POA: Diagnosis present

## 2021-12-07 DIAGNOSIS — N1831 Chronic kidney disease, stage 3a: Secondary | ICD-10-CM

## 2021-12-07 DIAGNOSIS — J209 Acute bronchitis, unspecified: Secondary | ICD-10-CM | POA: Diagnosis present

## 2021-12-07 DIAGNOSIS — E785 Hyperlipidemia, unspecified: Secondary | ICD-10-CM | POA: Diagnosis present

## 2021-12-07 DIAGNOSIS — I5033 Acute on chronic diastolic (congestive) heart failure: Secondary | ICD-10-CM | POA: Diagnosis present

## 2021-12-07 DIAGNOSIS — I2510 Atherosclerotic heart disease of native coronary artery without angina pectoris: Secondary | ICD-10-CM | POA: Diagnosis present

## 2021-12-07 DIAGNOSIS — N183 Chronic kidney disease, stage 3 unspecified: Secondary | ICD-10-CM | POA: Diagnosis present

## 2021-12-07 HISTORY — DX: Acute diastolic (congestive) heart failure: I50.31

## 2021-12-07 LAB — RESP PANEL BY RT-PCR (FLU A&B, COVID) ARPGX2
Influenza A by PCR: NEGATIVE
Influenza B by PCR: NEGATIVE
SARS Coronavirus 2 by RT PCR: NEGATIVE

## 2021-12-07 LAB — BASIC METABOLIC PANEL
Anion gap: 8 (ref 5–15)
BUN: 23 mg/dL (ref 8–23)
CO2: 24 mmol/L (ref 22–32)
Calcium: 8.8 mg/dL — ABNORMAL LOW (ref 8.9–10.3)
Chloride: 99 mmol/L (ref 98–111)
Creatinine, Ser: 1.61 mg/dL — ABNORMAL HIGH (ref 0.61–1.24)
GFR, Estimated: 39 mL/min — ABNORMAL LOW (ref >60)
Glucose, Bld: 217 mg/dL — ABNORMAL HIGH (ref 70–99)
Potassium: 4.7 mmol/L (ref 3.5–5.1)
Sodium: 131 mmol/L — ABNORMAL LOW (ref 135–145)

## 2021-12-07 LAB — TROPONIN I (HIGH SENSITIVITY): Troponin I (High Sensitivity): 21 ng/L — ABNORMAL HIGH (ref <18)

## 2021-12-07 LAB — GLUCOSE, CAPILLARY: Glucose-Capillary: 141 mg/dL — ABNORMAL HIGH (ref 70–99)

## 2021-12-07 LAB — DIFFERENTIAL
Abs Immature Granulocytes: 0.02 10*3/uL (ref 0.00–0.07)
Basophils Absolute: 0 10*3/uL (ref 0.0–0.1)
Basophils Relative: 0 %
Eosinophils Absolute: 0.1 10*3/uL (ref 0.0–0.5)
Eosinophils Relative: 1 %
Immature Granulocytes: 0 %
Lymphocytes Relative: 12 %
Lymphs Abs: 1.1 10*3/uL (ref 0.7–4.0)
Monocytes Absolute: 2.4 10*3/uL — ABNORMAL HIGH (ref 0.1–1.0)
Monocytes Relative: 27 %
Neutro Abs: 5.3 10*3/uL (ref 1.7–7.7)
Neutrophils Relative %: 60 %

## 2021-12-07 LAB — CBC
HCT: 36.6 % — ABNORMAL LOW (ref 39.0–52.0)
Hemoglobin: 11.7 g/dL — ABNORMAL LOW (ref 13.0–17.0)
MCH: 28.3 pg (ref 26.0–34.0)
MCHC: 32 g/dL (ref 30.0–36.0)
MCV: 88.6 fL (ref 80.0–100.0)
Platelets: 172 10*3/uL (ref 150–400)
RBC: 4.13 MIL/uL — ABNORMAL LOW (ref 4.22–5.81)
RDW: 14 % (ref 11.5–15.5)
WBC: 8.9 10*3/uL (ref 4.0–10.5)
nRBC: 0 % (ref 0.0–0.2)

## 2021-12-07 LAB — BRAIN NATRIURETIC PEPTIDE: B Natriuretic Peptide: 995 pg/mL — ABNORMAL HIGH (ref 0.0–100.0)

## 2021-12-07 MED ORDER — AMLODIPINE BESYLATE 5 MG PO TABS
2.5000 mg | ORAL_TABLET | Freq: Every day | ORAL | Status: DC
  Administered 2021-12-07 – 2021-12-09 (×3): 2.5 mg via ORAL
  Filled 2021-12-07 (×3): qty 1

## 2021-12-07 MED ORDER — ALBUTEROL SULFATE HFA 108 (90 BASE) MCG/ACT IN AERS: 2.0000 | INHALATION_SPRAY | RESPIRATORY_TRACT | Status: DC | PRN

## 2021-12-07 MED ORDER — HYDRALAZINE HCL 20 MG/ML IJ SOLN: 5.0000 mg | Freq: Four times a day (QID) | INTRAMUSCULAR | Status: DC | PRN

## 2021-12-07 MED ORDER — ENOXAPARIN SODIUM 40 MG/0.4ML IJ SOSY: 40.0000 mg | PREFILLED_SYRINGE | INTRAMUSCULAR | Status: DC

## 2021-12-07 MED ORDER — ACETAMINOPHEN 650 MG RE SUPP: 650.0000 mg | Freq: Four times a day (QID) | RECTAL | Status: DC | PRN

## 2021-12-07 MED ORDER — BENZONATATE 100 MG PO CAPS
200.0000 mg | ORAL_CAPSULE | Freq: Two times a day (BID) | ORAL | Status: DC | PRN
  Administered 2021-12-07: 200 mg via ORAL
  Filled 2021-12-07: qty 2

## 2021-12-07 MED ORDER — DORZOLAMIDE HCL-TIMOLOL MAL 2-0.5 % OP SOLN
1.0000 [drp] | Freq: Two times a day (BID) | OPHTHALMIC | Status: DC
  Administered 2021-12-07 – 2021-12-09 (×5): 1 [drp] via OPHTHALMIC
  Filled 2021-12-07: qty 10

## 2021-12-07 MED ORDER — FUROSEMIDE 10 MG/ML IJ SOLN
40.0000 mg | Freq: Once | INTRAMUSCULAR | Status: AC
  Administered 2021-12-07: 40 mg via INTRAVENOUS
  Filled 2021-12-07: qty 4

## 2021-12-07 MED ORDER — ASPIRIN 81 MG PO CHEW
81.0000 mg | CHEWABLE_TABLET | Freq: Every day | ORAL | Status: DC
  Administered 2021-12-07 – 2021-12-09 (×3): 81 mg via ORAL
  Filled 2021-12-07 (×3): qty 1

## 2021-12-07 MED ORDER — ROSUVASTATIN CALCIUM 10 MG PO TABS
10.0000 mg | ORAL_TABLET | Freq: Every day | ORAL | Status: DC
  Administered 2021-12-07 – 2021-12-09 (×3): 10 mg via ORAL
  Filled 2021-12-07 (×3): qty 1

## 2021-12-07 MED ORDER — INSULIN ASPART 100 UNIT/ML IJ SOLN
0.0000 [IU] | Freq: Three times a day (TID) | INTRAMUSCULAR | Status: DC
  Administered 2021-12-07: 2 [IU] via SUBCUTANEOUS
  Administered 2021-12-08 (×2): 3 [IU] via SUBCUTANEOUS
  Administered 2021-12-09: 2 [IU] via SUBCUTANEOUS
  Administered 2021-12-09: 3 [IU] via SUBCUTANEOUS

## 2021-12-07 MED ORDER — FUROSEMIDE 10 MG/ML IJ SOLN
20.0000 mg | Freq: Once | INTRAMUSCULAR | Status: AC
  Administered 2021-12-07: 20 mg via INTRAVENOUS
  Filled 2021-12-07: qty 2

## 2021-12-07 MED ORDER — FUROSEMIDE 10 MG/ML IJ SOLN
20.0000 mg | Freq: Two times a day (BID) | INTRAMUSCULAR | Status: DC
  Administered 2021-12-07 – 2021-12-08 (×2): 20 mg via INTRAVENOUS
  Filled 2021-12-07 (×2): qty 2

## 2021-12-07 MED ORDER — ONDANSETRON HCL 4 MG PO TABS: 4.0000 mg | ORAL_TABLET | Freq: Four times a day (QID) | ORAL | Status: DC | PRN

## 2021-12-07 MED ORDER — ENOXAPARIN SODIUM 30 MG/0.3ML IJ SOSY
30.0000 mg | PREFILLED_SYRINGE | INTRAMUSCULAR | Status: DC
  Administered 2021-12-07: 30 mg via SUBCUTANEOUS
  Filled 2021-12-07: qty 0.3

## 2021-12-07 MED ORDER — LATANOPROST 0.005 % OP SOLN
1.0000 [drp] | Freq: Every day | OPHTHALMIC | Status: DC
  Administered 2021-12-07 – 2021-12-08 (×2): 1 [drp] via OPHTHALMIC
  Filled 2021-12-07: qty 2.5

## 2021-12-07 MED ORDER — ACETAMINOPHEN 325 MG PO TABS
650.0000 mg | ORAL_TABLET | Freq: Four times a day (QID) | ORAL | Status: DC | PRN
  Administered 2021-12-07: 650 mg via ORAL
  Filled 2021-12-07: qty 2

## 2021-12-07 MED ORDER — INSULIN ASPART 100 UNIT/ML IJ SOLN: 0.0000 [IU] | Freq: Every day | INTRAMUSCULAR | Status: DC

## 2021-12-07 MED ORDER — ONDANSETRON HCL 4 MG/2ML IJ SOLN
4.0000 mg | Freq: Four times a day (QID) | INTRAMUSCULAR | Status: DC | PRN
Start: 2021-12-07 — End: 2021-12-09

## 2021-12-07 MED ORDER — POTASSIUM CHLORIDE CRYS ER 20 MEQ PO TBCR
20.0000 meq | EXTENDED_RELEASE_TABLET | Freq: Every day | ORAL | Status: DC
  Administered 2021-12-08: 20 meq via ORAL
  Filled 2021-12-07: qty 1

## 2021-12-07 NOTE — Assessment & Plan Note (Signed)
Continue statin. 

## 2021-12-07 NOTE — Assessment & Plan Note (Addendum)
Chronically on valsartan and Norvasc ?Hold valsartan for now with elevated creatinine ?Continue Norvasc ?We will use hydralazine as needed ?Blood pressures thus far appear to have been stable ?

## 2021-12-07 NOTE — Progress Notes (Signed)
Pt arrived to room #312 via stretcher from ED. Pt moved from stretcher to bed by staff. Pt A&O, denies c/o pain. Congested, non-productive cough noted. Pt wet with urine, cleaned and new male-wick external catheter applied. Tele applied per order. Pt and family member oriented to room and safety measures, state understanding. Bed alarm on for safety. ?

## 2021-12-07 NOTE — H&P (Signed)
?History and Physical  ? ? ?Patient: Roy Ibarra QVZ:[id number] DOB: 01/11/1923 ?DOA: 12/07/2021 ?DOS: the patient was seen and examined on 12/07/2021 ?PCP: Janora Norlander, DO  ?Patient coming from: Home ? ?Chief Complaint:  ?Chief Complaint  ?Patient presents with  ? Shortness of Breath  ? ? ?HPI: Roy Ibarra is a 88 y.o. male with medical history significant of hypertension, diabetes, chronic kidney disease stage IIIa, is brought to the hospital today with increasing fatigue, shortness of breath and cough.  History is obtained from the patient as well as his daughter.  Patient has had a cough for the past week, worse over the past 24 hours.  He has been feeling increasingly fatigued.  He has not had any fever.  No vomiting or diarrhea.  He normally ambulates, occasionally with a walker.  There was some concern that he may have an underlying infectious process since other family members that he was in contact with appears to have a respiratory illness.  He did not have any chest pain.  He was brought to the ER for evaluation where chest x-ray indicated pulmonary edema.  BNP noted to be elevated at 990.  Troponin mildly elevated at 20.  He received a dose of intravenous Lasix in the emergency room with good urine output and some improvement of his respiratory status.  He has been referred for admission. ? ?Review of Systems: As mentioned in the history of present illness. All other systems reviewed and are negative. ?Past Medical History:  ?Diagnosis Date  ? CAD (coronary artery disease)   ? CKD (chronic kidney disease), stage III ([place])   ? Hyperlipidemia   ? Hypertension   ? Ischemic cardiomyopathy   ? ?Past Surgical History:  ?Procedure Laterality Date  ? INGUINAL HERNIA REPAIR    ? PROSTATE BIOPSY    ? STENT PLACEMENT VASCULAR ([facility] HX)  11/2017  ? ?Social History:  reports that he has never smoked. He has never used smokeless tobacco. He reports that he does not currently use alcohol. He reports that  he does not use drugs. ? ?Allergies  ?Allergen Reactions  ? Ace Inhibitors Swelling  ? ? ?Family History  ?Problem Relation Age of Onset  ? Diabetes Brother   ? Diabetes Brother   ? Diabetes Brother   ? ? ?Prior to Admission medications   ?Medication Sig Start Date End Date Taking? Authorizing Provider  ?amLODipine (NORVASC) 2.5 MG tablet Take 1 tablet (2.5 mg total) by mouth daily. 05/31/21   Janora Norlander, DO  ?aspirin 81 MG chewable tablet Chew by mouth daily.    [provider]  ?blood glucose meter kit and supplies Dispense based on patient and insurance preference. Use up to four times daily as directed. (FOR ICD-10 E10.9, E11.9). 11/04/21   Janora Norlander, DO  ?dorzolamide-timolol (COSOPT) 22.3-6.8 MG/ML ophthalmic solution 1 drop 2 (two) times daily.    [provider]  ?glucose blood test strip Check BGs daily. E11.9 11/04/21   Janora Norlander, DO  ?Lancet Device MISC UAD to check BGs daily E11.9 11/04/21   Ronnie Doss M, DO  ?latanoprost (XALATAN) 0.005 % ophthalmic solution 1 drop at bedtime.    [provider]  ?Multiple Vitamin (MULTIVITAMIN) tablet Take 1 tablet by mouth daily.    [provider]  ?rosuvastatin (CRESTOR) 10 MG tablet Take 1 tablet (10 mg total) by mouth daily. 05/31/21   Janora Norlander, DO  ?valsartan (DIOVAN) 80 MG tablet Take 1 tablet (80  mg total) by mouth daily. 11/09/20   Erven Colla, DO  ? ? ?Physical Exam: ?Vitals:  ? 12/07/21 1330 12/07/21 1430 12/07/21 1500 12/07/21 1531  ?BP: 136/70 (!) 152/74 (!) 143/72 (!) 159/81  ?Pulse: 64 71 70 70  ?Resp: (!) 25 (!) 22 18   ?Temp:    98.7 ?F (37.1 ?C)  ?TempSrc:      ?SpO2: 99% 98% 99% 100%  ?Weight:      ?Height:      ? ?General exam: Alert, awake, oriented x 3 ?Respiratory system: Bilateral crackles. Respiratory effort normal. ?Cardiovascular system:RRR. No murmurs, rubs, gallops. ?Gastrointestinal system: Abdomen is nondistended, soft and nontender. No organomegaly or masses  felt. Normal bowel sounds heard. ?Central nervous system: Alert and oriented. No focal neurological deficits. ?Extremities: 1+ edema bilaterally ?Skin: No rashes, lesions or ulcers ?Psychiatry: Judgement and insight appear normal. Mood & affect appropriate.  ? ? ?Data Reviewed: ? ?Reviewed chest x-ray chemistry, BNP, troponin and CBC.  Reviewed EKG. ? ?Assessment and Plan: ?* Acute CHF (congestive heart failure) ([place]) ?Presented with shortness of breath and cough ?Noted to have elevated BNP and chest x-ray indicative of pulmonary edema ?Received intravenous Lasix with good urine output ?Continue on Lasix twice daily for now ?Update echocardiogram ? ?CKD (chronic kidney disease) stage 3, GFR 30-59 ml/min (HCC) ?Baseline creatinine appears to be around 1.2-1.4 ?Continue to monitor creatinine in the setting of diuresis ? ?Hyperlipidemia associated with type 2 diabetes mellitus ([place]) ?Continue statin ? ?Hypertension associated with diabetes ([place]) ?Chronically on valsartan and Norvasc ?Hold valsartan for now with elevated creatinine ?Continue Norvasc ?We will use hydralazine as needed ? ?Diabetes mellitus ([place]) ?Noted to be hyperglycemic on admission with a blood sugar 217 ?He is on dietary modifications ?Recheck A1c ?Monitor on sliding scale insulin ? ? ? ? ? ? ?Advance Care Planning:   Code Status: Full Code confirmed with patient's daughter ? ?Consults:  ? ?Family Communication: Reviewed plan with patient's daughter, Dr. Tula Nakayama. ? ?Severity of Illness: ?The appropriate patient status for this patient is INPATIENT. Inpatient status is judged to be reasonable and necessary in order to provide the required intensity of service to ensure the patient's safety. The patient's presenting symptoms, physical exam findings, and initial radiographic and laboratory data in the context of their chronic comorbidities is felt to place them at high risk for further clinical deterioration. Furthermore, it is not anticipated  that the patient will be medically stable for discharge from the hospital within 2 midnights of admission.  ? ?* I certify that at the point of admission it is my clinical judgment that the patient will require inpatient hospital care spanning beyond 2 midnights from the point of admission due to high intensity of service, high risk for further deterioration and high frequency of surveillance required.* ? ?Author: ?Kathie Dike, MD ?12/07/2021 7:18 PM ? ?For on call review [email or web address].  ?

## 2021-12-07 NOTE — ED Notes (Signed)
ED TO INPATIENT HANDOFF REPORT  ED Nurse Name and Phone #: [phone]  S Name/Age/Gender Roy Ibarra 88 y.o. male Room/Bed: APA09/APA09  Code Status   Code Status: Full Code  Home/SNF/Other Home Patient oriented to: self, place, time, and situation Is this baseline? Yes   Triage Complete: Triage complete  Chief Complaint Acute CHF (congestive heart failure) ([place]) [I50.9]  Triage Note Daughter states that patient has been coughing with [facility] for the past two days. Temp yesterday 99.6. States that she is concerned that patient is going to decompensate. Cough dry. Reports generalized fatigue.Family member sick last week.    Allergies Allergies  Allergen Reactions   Ace Inhibitors Swelling    Level of Care/Admitting Diagnosis ED Disposition     ED Disposition  Admit   Condition  --   Comment  Hospital Area: [facility] [id number]  Level of Care: Telemetry [5]  Covid Evaluation: Confirmed COVID Negative  Diagnosis: Acute CHF (congestive heart failure) [facility]) [id number]  Admitting Physician: [place], [place]  Attending Physician: Kathie Dike [[id number]]  Estimated length of stay: past midnight tomorrow  Certification:: I certify this patient will need inpatient services for at least 2 midnights          B Medical/Surgery History Past Medical History:  Diagnosis Date   CAD (coronary artery disease)    CKD (chronic kidney disease), stage III ([place])    Hyperlipidemia    Hypertension    Ischemic cardiomyopathy    Past Surgical History:  Procedure Laterality Date   INGUINAL HERNIA REPAIR     PROSTATE BIOPSY     STENT PLACEMENT VASCULAR ([place] HX)  11/2017     A IV Location/Drains/Wounds Patient Lines/Drains/Airways Status     Active Line/Drains/Airways     Name Placement date Placement time Site Days   Peripheral IV 12/07/21 20 G Anterior;Left Antecubital 12/07/21  0940  Antecubital  less than 1            Intake/Output Last 24  hours No intake or output data in the 24 hours ending 12/07/21 1504  Labs/Imaging Results for orders placed or performed during the hospital encounter of 12/07/21 (from the past 48 hour(s))  Basic metabolic panel     Status: Abnormal   Collection Time: 12/07/21  9:40 AM  Result Value Ref Range   Sodium 131 (L) 135 - 145 mmol/L   Potassium 4.7 3.5 - 5.1 mmol/L   Chloride 99 98 - 111 mmol/L   CO2 24 22 - 32 mmol/L   Glucose, Bld 217 (H) 70 - 99 mg/dL    Comment: Glucose reference range applies only to samples taken after fasting for at least 8 hours.   BUN 23 8 - 23 mg/dL   Creatinine, Ser 1.61 (H) 0.61 - 1.24 mg/dL   Calcium 8.8 (L) 8.9 - 10.3 mg/dL   GFR, Estimated 39 (L) >60 mL/min    Comment: (NOTE) Calculated using the CKD-EPI Creatinine Equation (2021)    Anion gap 8 5 - 15    Comment: Performed at [facility], [address] [place], [place] [address]  CBC     Status: Abnormal   Collection Time: 12/07/21  9:40 AM  Result Value Ref Range   WBC 8.9 4.0 - 10.5 K/uL   RBC 4.13 (L) 4.22 - 5.81 MIL/uL   Hemoglobin 11.7 (L) 13.0 - 17.0 g/dL   HCT 36.6 (L) 39.0 - 52.0 %   MCV 88.6 80.0 - 100.0 fL   MCH 28.3 26.0 -  34.0 pg   MCHC 32.0 30.0 - 36.0 g/dL   RDW 14.0 11.5 - 15.5 %   Platelets 172 150 - 400 K/uL   nRBC 0.0 0.0 - 0.2 %    Comment: Performed at [facility], [address], [place], [place] [address]  Differential     Status: Abnormal   Collection Time: 12/07/21  9:40 AM  Result Value Ref Range   Neutrophils Relative % 60 %   Neutro Abs 5.3 1.7 - 7.7 K/uL   Lymphocytes Relative 12 %   Lymphs Abs 1.1 0.7 - 4.0 K/uL   Monocytes Relative 27 %   Monocytes Absolute 2.4 (H) 0.1 - 1.0 K/uL   Eosinophils Relative 1 %   Eosinophils Absolute 0.1 0.0 - 0.5 K/uL   Basophils Relative 0 %   Basophils Absolute 0.0 0.0 - 0.1 K/uL   Immature Granulocytes 0 %   Abs Immature Granulocytes 0.02 0.00 - 0.07 K/uL    Comment: Performed at [facility], [address]  [place], [place] [address]  Brain natriuretic peptide     Status: Abnormal   Collection Time: 12/07/21  9:40 AM  Result Value Ref Range   B Natriuretic Peptide 995.0 (H) 0.0 - 100.0 pg/mL    Comment: Performed at [facility], [address] [place], [place] [address]  Troponin I (High Sensitivity)     Status: Abnormal   Collection Time: 12/07/21  9:40 AM  Result Value Ref Range   Troponin I (High Sensitivity) 21 (H) <18 ng/L    Comment: (NOTE) Elevated high sensitivity troponin I (hsTnI) values and significant  changes across serial measurements may suggest ACS but many other  chronic and acute conditions are known to elevate hsTnI results.  Refer to the "Links" section for chest pain algorithms and additional  guidance. Performed at [facility], [address], [place], [place] [address]   Resp Panel by RT-PCR (Flu A&B, Covid) Nasopharyngeal Swab     Status: None   Collection Time: 12/07/21 10:00 AM   Specimen: Nasopharyngeal Swab; Nasopharyngeal(NP) swabs in vial transport medium  Result Value Ref Range   SARS Coronavirus 2 by RT PCR NEGATIVE NEGATIVE    Comment: (NOTE) SARS-CoV-2 target nucleic acids are NOT DETECTED.  The SARS-CoV-2 RNA is generally detectable in upper respiratory specimens during the acute phase of infection. The lowest concentration of SARS-CoV-2 viral copies this assay can detect is 138 copies/mL. A negative result does not preclude SARS-Cov-2 infection and should not be used as the sole basis for treatment or other patient management decisions. A negative result may occur with  improper specimen collection/handling, submission of specimen other than nasopharyngeal swab, presence of viral mutation(s) within the areas targeted by this assay, and inadequate number of viral copies(<138 copies/mL). A negative result must be combined with clinical observations, patient history, and epidemiological information. The expected result is Negative.  Fact Sheet for  Patients:  EntrepreneurPulse.com.au  Fact Sheet for Healthcare Providers:  IncredibleEmployment.be  This test is no t yet approved or cleared by the [place] FDA and  has been authorized for detection and/or diagnosis of SARS-CoV-2 by FDA under an Emergency Use Authorization (EUA). This EUA will remain  in effect (meaning this test can be used) for the duration of the COVID-19 declaration under Section 564(b)(1) of the Act, 21 U.S.C.section 360bbb-3(b)(1), unless the authorization is terminated  or revoked sooner.       Influenza A by PCR NEGATIVE NEGATIVE   Influenza B by PCR  NEGATIVE NEGATIVE    Comment: (NOTE) The Xpert Xpress SARS-CoV-2/FLU/RSV plus assay is intended as an aid in the diagnosis of influenza from Nasopharyngeal swab specimens and should not be used as a sole basis for treatment. Nasal washings and aspirates are unacceptable for Xpert Xpress SARS-CoV-2/FLU/RSV testing.  Fact Sheet for Patients: EntrepreneurPulse.com.au  Fact Sheet for Healthcare Providers: IncredibleEmployment.be  This test is not yet approved or cleared by the [place] FDA and has been authorized for detection and/or diagnosis of SARS-CoV-2 by FDA under an Emergency Use Authorization (EUA). This EUA will remain in effect (meaning this test can be used) for the duration of the COVID-19 declaration under Section 564(b)(1) of the Act, 21 U.S.C. section 360bbb-3(b)(1), unless the authorization is terminated or revoked.  Performed at [facility], [address], [place], [place] [address]    DG Chest 2 View  Result Date: 12/07/2021 CLINICAL DATA:  Shortness of breath EXAM: CHEST - 2 VIEW COMPARISON:  12/28/2009 FINDINGS: Bilateral mild interstitial thickening. No focal consolidation. No pleural effusion or pneumothorax. Mild cardiomegaly. No acute osseous abnormality. IMPRESSION: 1. Cardiomegaly with mild pulmonary  vascular congestion. Electronically Signed   By: Kathreen Devoid M.D.   On: 12/07/2021 10:24    Pending Labs Unresulted Labs (From admission, onward)     Start     Ordered   12/14/21 0500  Creatinine, serum  (enoxaparin (LOVENOX)    CrCl >/= 30 ml/min)  Weekly,   R     Comments: while on enoxaparin therapy    12/07/21 1454   12/08/21 0500  Brain natriuretic peptide  (Heart Failure)  Tomorrow morning,   R        12/07/21 1454   12/08/21 0500  TSH  Tomorrow morning,   R        12/07/21 1454   12/08/21 [id number]  Basic metabolic panel  Tomorrow morning,   R        12/07/21 1454   12/08/21 0500  CBC  Tomorrow morning,   R        12/07/21 1454   12/08/21 0500  Hemoglobin A1c  (Glycemic Control (SSI)  Q 4 Hours / Glycemic Control (SSI)  AC +/- HS)  Tomorrow morning,   R       Comments: To assess prior glycemic control    12/07/21 1454            Vitals/Pain Today's Vitals   12/07/21 1045 12/07/21 1100 12/07/21 1200 12/07/21 1230  BP:  114/62 138/70 138/72  Pulse: 66 65 69 69  Resp: (!) 28 (!) 25 (!) 25 (!) 33  Temp:      TempSrc:      SpO2: 100% 98% 95% 98%  Weight:      Height:      PainSc:        Isolation Precautions No active isolations  Medications Medications  albuterol (VENTOLIN HFA) 108 (90 Base) MCG/ACT inhaler 2 puff (has no administration in time range)  aspirin chewable tablet 81 mg (has no administration in time range)  amLODipine (NORVASC) tablet 2.5 mg (has no administration in time range)  rosuvastatin (CRESTOR) tablet 10 mg (has no administration in time range)  dorzolamide-timolol (COSOPT) 22.3-6.8 MG/ML ophthalmic solution 1 drop (has no administration in time range)  latanoprost (XALATAN) 0.005 % ophthalmic solution 1 drop (has no administration in time range)  furosemide (LASIX) injection 20 mg (has no administration in time range)  potassium chloride SA (KLOR-CON M) CR tablet 20 mEq (  has no administration in time range)  enoxaparin (LOVENOX) injection  40 mg (has no administration in time range)  acetaminophen (TYLENOL) tablet 650 mg (has no administration in time range)    Or  acetaminophen (TYLENOL) suppository 650 mg (has no administration in time range)  ondansetron (ZOFRAN) tablet 4 mg (has no administration in time range)    Or  ondansetron (ZOFRAN) injection 4 mg (has no administration in time range)  insulin aspart (novoLOG) injection 0-15 Units (has no administration in time range)  insulin aspart (novoLOG) injection 0-5 Units (has no administration in time range)  furosemide (LASIX) injection 40 mg (40 mg Intravenous Given 12/07/21 1253)    Mobility walks with device High fall risk   Focused Assessments    R Recommendations: See Admitting Provider Note  Report given to:   Additional Notes:

## 2021-12-07 NOTE — Assessment & Plan Note (Addendum)
Noted to be hyperglycemic on admission with a blood sugar 217 ?He is on dietary modifications ?A1c 8.2 ?He was monitored on sliding scale blood sugars remained stable ?Continue to monitor at home with dietary restrictions ?

## 2021-12-07 NOTE — ED Triage Notes (Signed)
Daughter states that patient has been coughing with [facility] for the past two days. Temp yesterday 99.6. States that she is concerned that patient is going to decompensate. Cough dry. Reports generalized fatigue.Family member sick last week.  ?

## 2021-12-07 NOTE — ED Provider Notes (Signed)
[name] EMERGENCY DEPARTMENT Provider Note   CSN: [id number] Arrival date & time: 12/07/21  8250     History  Chief Complaint  Patient presents with   Shortness of Breath    Roy Ibarra is a 88 y.o. male with history of diabetes not currently on medications, CAD, hypertension and hyperlipidemia who presents to the ED today for shortness of breath for approximately 2 days.  Patient is accompanied by his daughter, Dr. Tula Nakayama, MD who provides most of the history.  She notes that her husband had a respiratory illness last week, and shortly after this resolved, patient developed symptoms as well..  Initially his symptoms started with a mild, dry cough, however he has been progressively worsening and she is worried that he will soon decompensate.  She has been giving him Robitussin and denies fever.  Cough has continued to be nonproductive but has seemed more aggressive.  Notes that his bowel sounds have been hyperactive, however denies diarrhea and vomiting.  She additionally denies leg swelling, previous history of DVT or PE.   Patient most recently had an echo in May 2021 with approximately 55 to 60% left ventricular function.  No previous history of CHF.   Shortness of Breath     Home Medications Prior to Admission medications   Medication Sig Start Date End Date Taking? Authorizing Provider  amLODipine (NORVASC) 2.5 MG tablet Take 1 tablet (2.5 mg total) by mouth daily. 05/31/21   Janora Norlander, DO  aspirin 81 MG chewable tablet Chew by mouth daily.    [provider]  blood glucose meter kit and supplies Dispense based on patient and insurance preference. Use up to four times daily as directed. (FOR ICD-10 E10.9, E11.9). 11/04/21   Janora Norlander, DO  dorzolamide-timolol (COSOPT) 22.3-6.8 MG/ML ophthalmic solution 1 drop 2 (two) times daily.    [provider]  glucose blood test strip Check BGs daily. E11.9 11/04/21   Janora Norlander,  DO  Lancet Device MISC UAD to check BGs daily E11.9 11/04/21   Ronnie Doss M, DO  latanoprost (XALATAN) 0.005 % ophthalmic solution 1 drop at bedtime.    [provider]  Multiple Vitamin (MULTIVITAMIN) tablet Take 1 tablet by mouth daily.    [provider]  rosuvastatin (CRESTOR) 10 MG tablet Take 1 tablet (10 mg total) by mouth daily. 05/31/21   Janora Norlander, DO  valsartan (DIOVAN) 80 MG tablet Take 1 tablet (80 mg total) by mouth daily. 11/09/20   Erven Colla, DO      Allergies    Ace inhibitors    Review of Systems   Review of Systems  Respiratory:  Positive for shortness of breath.    Physical Exam Updated Vital Signs BP 138/72    Pulse 69    Temp 98.8 F (37.1 C) (Oral)    Resp (!) 33    Ht _0  (1.803 m)    Wt 81.6 kg    SpO2 98%    BMI 25.10 kg/m  Physical Exam Vitals and nursing note reviewed.  Constitutional:      General: He is not in acute distress.    Appearance: He is not ill-appearing.  HENT:     Head: Atraumatic.  Eyes:     Conjunctiva/sclera: Conjunctivae normal.  Cardiovascular:     Rate and Rhythm: Normal rate and regular rhythm.     Pulses:          Radial pulses are 2+ on  the right side and 2+ on the left side.       Dorsalis pedis pulses are 1+ on the right side and 1+ on the left side.     Heart sounds: No murmur heard.    Comments: Diminished distal pulses of the lower extremities bilaterally.  Negative leg edema bilaterally.  No JVD Pulmonary:     Effort: Pulmonary effort is normal. No respiratory distress.     Breath sounds: Normal breath sounds.     Comments: Tachypnea, with normal respiratory effort.  Lungs CTA bilaterally.  Dry hacking cough noted in exam, nonproductive Abdominal:     General: Abdomen is flat. There is no distension.     Palpations: Abdomen is soft.     Tenderness: There is no abdominal tenderness.  Musculoskeletal:        General: Normal range of motion.     Cervical back: Normal range of  motion.     Comments: No posterior calf tenderness or swelling bilaterally  Skin:    General: Skin is warm and dry.     Capillary Refill: Capillary refill takes less than 2 seconds.  Neurological:     General: No focal deficit present.     Mental Status: He is alert.  Psychiatric:        Mood and Affect: Mood normal.    ED Results / Procedures / Treatments   Labs (all labs ordered are listed, but only abnormal results are displayed) Labs Reviewed  BASIC METABOLIC PANEL - Abnormal; Notable for the following components:      Result Value   Sodium 131 (*)    Glucose, Bld 217 (*)    Creatinine, Ser 1.61 (*)    Calcium 8.8 (*)    GFR, Estimated 39 (*)    All other components within normal limits  CBC - Abnormal; Notable for the following components:   RBC 4.13 (*)    Hemoglobin 11.7 (*)    HCT 36.6 (*)    All other components within normal limits  DIFFERENTIAL - Abnormal; Notable for the following components:   Monocytes Absolute 2.4 (*)    All other components within normal limits  BRAIN NATRIURETIC PEPTIDE - Abnormal; Notable for the following components:   B Natriuretic Peptide 995.0 (*)    All other components within normal limits  RESP PANEL BY RT-PCR (FLU A&B, COVID) [id number]  TROPONIN I (HIGH SENSITIVITY)    EKG EKG Interpretation  Date/Time:  Saturday December 07 2021 09:31:20 EST Ventricular Rate:  124 PR Interval:    QRS Duration: 114 QT Interval:  328 QTC Calculation: 472 R Axis:   -47 Text Interpretation: Junctional tachycardia Incomplete right bundle branch block LVH with IVCD, LAD and secondary repol abnrm Baseline wander in lead(s) V5 Confirmed by Noemi Chapel [phone]) on 12/07/2021 10:05:15 AM  Radiology DG Chest 2 View  Result Date: 12/07/2021 CLINICAL DATA:  Shortness of breath EXAM: CHEST - 2 VIEW COMPARISON:  12/28/2009 FINDINGS: Bilateral mild interstitial thickening. No focal consolidation. No pleural effusion or pneumothorax. Mild cardiomegaly. No acute  osseous abnormality. IMPRESSION: 1. Cardiomegaly with mild pulmonary vascular congestion. Electronically Signed   By: Kathreen Devoid M.D.   On: 12/07/2021 10:24    Procedures .Critical Care Performed by: Tonye Pearson, PA-C Authorized by: Tonye Pearson, PA-C   Critical care provider statement:    Critical care time (minutes):  30   Critical care start time:  12/07/2021 10:00 AM   Critical care end time:  12/07/2021  10:30 AM   Critical care was necessary to treat or prevent imminent or life-threatening deterioration of the following conditions:  Cardiac failure   Critical care was time spent personally by me on the following activities:  Development of treatment plan with patient or surrogate, discussions with consultants, evaluation of patient's response to treatment, examination of patient, ordering and review of laboratory studies, ordering and review of radiographic studies, ordering and performing treatments and interventions, pulse oximetry, re-evaluation of patient's condition, review of old charts and obtaining history from patient or surrogate   I assumed direction of critical care for this patient from another provider in my specialty: no     Care discussed with: admitting provider      Medications Ordered in ED Medications  albuterol (VENTOLIN HFA) 108 (90 Base) MCG/ACT inhaler 2 puff (has no administration in time range)  furosemide (LASIX) injection 40 mg (40 mg Intravenous Given 12/07/21 1253)    ED Course/ Medical Decision Making/ A&P Clinical Course as of 12/07/21 1342  Sat Dec 07, 2021  1013 EKG 12-Lead [EC]    Clinical Course User Index [EC] Tonye Pearson, PA-C                           Medical Decision Making Amount and/or Complexity of Data Reviewed Labs: ordered. Radiology: ordered. ECG/medicine tests:  Decision-making details documented in ED Course.  Risk Prescription drug management. Decision regarding hospitalization.   History:  Per HPI Social  determinants of health: none  Initial impression:  This patient presents to the ED for concern of shortness of breath, this involves an extensive number of treatment options, and is a complaint that carries with it a high risk of complications and morbidity.   The emergent differential diagnosis for shortness of breath includes, but is not limited to, Pulmonary edema, bronchoconstriction, Pneumonia, Pulmonary embolism, Pneumotherax/ Hemothorax, Dysrythmia, ACS.  This is a 88 year old male who appears his stated age in no acute distress.  He is noted to be tachycardic to the 120s and tachypneic to the 30s.  Otherwise afebrile with normal lung exam.  Oxygen at 100% on room air.  Will obtain basic labs and chest x-ray.  Initial concern for URI versus pulmonary embolism.   Lab Tests and EKG:  I Ordered, reviewed, and interpreted labs and EKG.  The pertinent results include:  BMP with glucose of 217, mildly increased AKI from evaluation 1 month ago. CBC without leukocytosis Respiratory panel negative BNP at 995   Imaging Studies ordered:  I ordered imaging studies including  Chest x-ray with cardiomegaly with some pulmonary congestion I independently visualized and interpreted imaging and I agree with the radiologist interpretation.    Cardiac Monitoring:  The patient was maintained on a cardiac monitor.  I personally viewed and interpreted the cardiac monitored which showed an underlying rhythm of: NSR   Medicines ordered and prescription drug management:  I ordered medication including: Lasix 40 mg IV Reevaluation of the patient after these medicines showed that the patient improved I have reviewed the patients home medicines and have made adjustments as needed   Critical Interventions:  Heart failure management as described above   ED Course: Patient initially brought to the ED by his daughter out of concern for increased shortness of breath over the last few days.  Lungs  were CTA bilaterally although he remains quite tachypneic up to 30 bpm.  Respiratory panel was negative, however chest x-ray revealed some new  cardiomegaly with pulmonary congestion.  BNP was added which was elevated up to near 1000.  Per patient's daughter, this is a new diagnosis.  Patient most recently had a echocardiogram in May 2021 with normal ejection fraction.  He does not appear acutely fluid overloaded, however he was given 40 mg IV with some mild improvement in symptoms.  He remains at 98% O2 on room air throughout his entire stay.  I discussed 2 options with daughter to include either admit for IV Lasix prior to discharge or if she feels comfortable, we can give him IV Lasix here in the emergency department and she can monitor at home.  Since this is a new diagnosis, Dr. Moshe Cipro would prefer that he is admitted until he is symptomatically managed before discharge.  Consult was placed to hospitalist and Dr. Roderic Palau agrees to admit patient.  Disposition:  After consideration of the diagnostic results, physical exam, history and the patients response to treatment feel that the patent would benefit from admission.   Acute CHF: Patient was admitted to medicine floor by Dr. Roderic Palau.  Ultimate treatment and disposition to be determined by admitting providers.    Final Clinical Impression(s) / ED Diagnoses Final diagnoses:  Acute congestive heart failure, unspecified heart failure type [facility])    Rx / DC Orders ED Discharge Orders     None         Rodena Piety 12/07/21 1342    Noemi Chapel, MD 12/08/21 [phone]

## 2021-12-07 NOTE — Assessment & Plan Note (Addendum)
Presented with shortness of breath and cough ?BNP elevated 995> 611 ?Chest x-ray indicated pulmonary edema ?Received intravenous Lasix with good urine output ?Overall volume status appears to have improved ?He does not have significant lower extremity edema ?Echocardiogram updated and shows ejection fraction of 60 to 65% with grade 1 diastolic dysfunction ?Seen by cardiology who agreed with current management.  Felt that his shortness of breath may be more related to bronchitis ?Follow-up with cardiology has been scheduled ?We will continue with Lasix as needed ?

## 2021-12-07 NOTE — Progress Notes (Signed)
?   12/07/21 2258  ?Vitals  ?Temp (!) 100.8 ?F (38.2 ?C)  ?Temp Source Oral  ?BP (!) 158/78  ?MAP (mmHg) 103  ?BP Location Right Arm  ?BP Method Automatic  ?Patient Position (if appropriate) Sitting  ?Pulse Rate 72  ?Pulse Rate Source Monitor  ?Resp 18  ?MEWS COLOR  ?MEWS Score Color Green  ?Oxygen Therapy  ?SpO2 98 %  ?O2 Device Room Air  ?MEWS Score  ?MEWS Temp 1  ?MEWS Systolic 0  ?MEWS Pulse 0  ?MEWS RR 0  ?MEWS LOC 0  ?MEWS Score 1  ? ? ?

## 2021-12-07 NOTE — Assessment & Plan Note (Addendum)
Baseline creatinine appears to be around 1.2-1.4 ?Continue to monitor creatinine in the setting of diuresis ?This did trend up to 1.7, likely related to diuresis and poor p.o. intake ?Encouraged increased fluid intake ?Can consider repeat labs in 1 week to ensure stability of renal function ?

## 2021-12-08 ENCOUNTER — Inpatient Hospital Stay (HOSPITAL_COMMUNITY): Payer: 59

## 2021-12-08 DIAGNOSIS — E1159 Type 2 diabetes mellitus with other circulatory complications: Secondary | ICD-10-CM

## 2021-12-08 DIAGNOSIS — I5031 Acute diastolic (congestive) heart failure: Secondary | ICD-10-CM

## 2021-12-08 DIAGNOSIS — R531 Weakness: Secondary | ICD-10-CM

## 2021-12-08 DIAGNOSIS — R509 Fever, unspecified: Secondary | ICD-10-CM

## 2021-12-08 DIAGNOSIS — I152 Hypertension secondary to endocrine disorders: Secondary | ICD-10-CM

## 2021-12-08 LAB — ECHOCARDIOGRAM COMPLETE
AR max vel: 2.4 cm2
AV Area VTI: 2.07 cm2
AV Area mean vel: 2.26 cm2
AV Mean grad: 4 mmHg
AV Peak grad: 6.6 mmHg
Ao pk vel: 1.28 m/s
Area-P 1/2: 4.13 cm2
Height: 71 in
MV M vel: 1.19 m/s
MV Peak grad: 5.7 mmHg
S' Lateral: 2.7 cm
Weight: 2977.09 oz

## 2021-12-08 LAB — HEMOGLOBIN A1C
Hgb A1c MFr Bld: 8.2 % — ABNORMAL HIGH (ref 4.8–5.6)
Mean Plasma Glucose: 188.64 mg/dL

## 2021-12-08 LAB — CBC
HCT: 39.4 % (ref 39.0–52.0)
Hemoglobin: 12.4 g/dL — ABNORMAL LOW (ref 13.0–17.0)
MCH: 27.4 pg (ref 26.0–34.0)
MCHC: 31.5 g/dL (ref 30.0–36.0)
MCV: 87.2 fL (ref 80.0–100.0)
Platelets: 184 10*3/uL (ref 150–400)
RBC: 4.52 MIL/uL (ref 4.22–5.81)
RDW: 13.5 % (ref 11.5–15.5)
WBC: 8.6 10*3/uL (ref 4.0–10.5)
nRBC: 0 % (ref 0.0–0.2)

## 2021-12-08 LAB — BRAIN NATRIURETIC PEPTIDE: B Natriuretic Peptide: 611 pg/mL — ABNORMAL HIGH (ref 0.0–100.0)

## 2021-12-08 LAB — BASIC METABOLIC PANEL
Anion gap: 11 (ref 5–15)
BUN: 23 mg/dL (ref 8–23)
CO2: 25 mmol/L (ref 22–32)
Calcium: 8.6 mg/dL — ABNORMAL LOW (ref 8.9–10.3)
Chloride: 96 mmol/L — ABNORMAL LOW (ref 98–111)
Creatinine, Ser: 1.5 mg/dL — ABNORMAL HIGH (ref 0.61–1.24)
GFR, Estimated: 42 mL/min — ABNORMAL LOW (ref >60)
Glucose, Bld: 168 mg/dL — ABNORMAL HIGH (ref 70–99)
Potassium: 3.9 mmol/L (ref 3.5–5.1)
Sodium: 132 mmol/L — ABNORMAL LOW (ref 135–145)

## 2021-12-08 LAB — GLUCOSE, CAPILLARY
Glucose-Capillary: 185 mg/dL — ABNORMAL HIGH (ref 70–99)
Glucose-Capillary: 168 mg/dL — ABNORMAL HIGH (ref 70–99)
Glucose-Capillary: 96 mg/dL (ref 70–99)
Glucose-Capillary: 180 mg/dL — ABNORMAL HIGH (ref 70–99)

## 2021-12-08 LAB — TSH: TSH: 1.337 u[IU]/mL (ref 0.350–4.500)

## 2021-12-08 MED ORDER — SODIUM CHLORIDE 0.9 % IV SOLN
2.0000 g | INTRAVENOUS | Status: DC
  Administered 2021-12-08 – 2021-12-09 (×2): 2 g via INTRAVENOUS
  Filled 2021-12-08 (×2): qty 20

## 2021-12-08 MED ORDER — ENOXAPARIN SODIUM 40 MG/0.4ML IJ SOSY
40.0000 mg | PREFILLED_SYRINGE | INTRAMUSCULAR | Status: DC
  Administered 2021-12-08: 40 mg via SUBCUTANEOUS
  Filled 2021-12-08: qty 0.4

## 2021-12-08 MED ORDER — HYDROCODONE BIT-HOMATROP MBR 5-1.5 MG/5ML PO SOLN
5.0000 mL | Freq: Four times a day (QID) | ORAL | Status: DC | PRN
  Administered 2021-12-09: 5 mL via ORAL
  Filled 2021-12-08: qty 5

## 2021-12-08 MED ORDER — IPRATROPIUM-ALBUTEROL 0.5-2.5 (3) MG/3ML IN SOLN
3.0000 mL | Freq: Two times a day (BID) | RESPIRATORY_TRACT | Status: DC
  Administered 2021-12-08 – 2021-12-09 (×3): 3 mL via RESPIRATORY_TRACT
  Filled 2021-12-08 (×3): qty 3

## 2021-12-08 MED ORDER — GUAIFENESIN ER 600 MG PO TB12
600.0000 mg | ORAL_TABLET | Freq: Two times a day (BID) | ORAL | Status: DC
  Administered 2021-12-08 – 2021-12-09 (×3): 600 mg via ORAL
  Filled 2021-12-08 (×3): qty 1

## 2021-12-08 MED ORDER — SODIUM CHLORIDE 0.9 % IV SOLN
500.0000 mg | INTRAVENOUS | Status: DC
  Administered 2021-12-08 – 2021-12-09 (×2): 500 mg via INTRAVENOUS
  Filled 2021-12-08 (×2): qty 5

## 2021-12-08 MED ORDER — PERFLUTREN LIPID MICROSPHERE
1.0000 mL | INTRAVENOUS | Status: AC | PRN
  Administered 2021-12-08: 3 mL via INTRAVENOUS
  Filled 2021-12-08: qty 10

## 2021-12-08 NOTE — Assessment & Plan Note (Addendum)
Patient was noted to have a mild temperature overnight.   ?He continues to have a productive cough ?Initial chest x-ray did not comment on any pneumonia ?COVID test was negative ?CT chest showed developing bronchitis versus bronchopneumonia ?Started empirically on ceftriaxone and azithromycin ?He has been transitioned to oral azithromycin and Keflex to complete his course ?Continue mucolytic's and other supportive measures ?

## 2021-12-08 NOTE — Assessment & Plan Note (Signed)
Seen by physical therapy with recommendations for home health ?

## 2021-12-08 NOTE — Progress Notes (Signed)
?Progress Note ? ? ?Patient: Roy Ibarra [id number] DOB: 21-Jan-1923 DOA: 12/07/2021     1 ?DOS: the patient was seen and examined on 12/08/2021 ?  ?Brief hospital course: ?88 year old male with a history of hypertension, diabetes, hyperlipidemia, noted to the hospital with persistent cough and shortness of breath.  Initial work-up indicated pulmonary edema and decompensated CHF.  He had elevated BNP and chest x-ray findings consistent with CHF.  He received intravenous Lasix with good urine output and overall improvement of respiratory status.  He continues to have cough and was mildly febrile.  CT chest performed indicated possible bronchitis versus early bronchopneumonia.  He is started on antibiotics. ? ?Assessment and Plan: ?* Acute diastolic CHF (congestive heart failure) ([place]) ?Presented with shortness of breath and cough ?BNP elevated 995> 611 ?Chest x-ray indicated pulmonary edema ?Received intravenous Lasix with good urine output ?Overall volume status appears to have improved ?He does not have significant lower extremity edema ?Hold further Lasix for today ?Echocardiogram updated and shows ejection fraction of 60 to 65% with grade 1 diastolic dysfunction ?We will request cardiology input in a.m. ? ?Generalized weakness ?Seen by physical therapy with recommendations for home health ? ?Fever ?Patient was noted to have a mild temperature overnight.   ?He continues to have a productive cough ?Initial chest x-ray did not comment on any pneumonia ?COVID test was negative ?Due to persistent concerns for pneumonia, will check CT chest ?Started empirically on ceftriaxone and azithromycin ? ?CKD (chronic kidney disease) stage 3, GFR 30-59 ml/min (HCC) ?Baseline creatinine appears to be around 1.2-1.4 ?Continue to monitor creatinine in the setting of diuresis ? ?Hyperlipidemia associated with type 2 diabetes mellitus ([place]) ?Continue statin ? ?Hypertension associated with diabetes ([place]) ?Chronically on valsartan  and Norvasc ?Hold valsartan for now with elevated creatinine ?Continue Norvasc ?We will use hydralazine as needed ?Blood pressures thus far appear to have been stable ? ?Diabetes mellitus ([place]) ?Noted to be hyperglycemic on admission with a blood sugar 217 ?He is on dietary modifications ?A1c 8.2 ?Monitor on sliding scale insulin ? ? ? ? ? ? ? ?Subjective: Patient has productive cough.  Overall shortness of breath appears to have improved since admission.  He did have a mild fever overnight. ? ?Physical Exam: ?Vitals:  ? 12/08/21 0501 12/08/21 0600 12/08/21 0807 12/08/21 1305  ?BP: (!) 141/70  (!) 144/70   ?Pulse: 78     ?Resp: 19     ?Temp: 99.4 ?F (37.4 ?C)     ?TempSrc:      ?SpO2: 98%   97%  ?Weight:  84.4 kg    ?Height:      ? ?General exam: Alert, awake, oriented x 3 ?Respiratory system: Mild bilateral wheeze. Respiratory effort normal. ?Cardiovascular system:RRR. No murmurs, rubs, gallops. ?Gastrointestinal system: Abdomen is nondistended, soft and nontender. No organomegaly or masses felt. Normal bowel sounds heard. ?Central nervous system: Alert and oriented. No focal neurological deficits. ?Extremities: No C/C/E, +pedal pulses ?Skin: No rashes, lesions or ulcers ?Psychiatry: Judgement and insight appear normal. Mood & affect appropriate.  ? ? ?Data Reviewed: ? ?Reviewed CBC and chemistry this morning.  Checking CT chest and repeat CBC and chemistry ? ?Family Communication: Updated patient's daughter at the bedside ? ?Disposition: ?Status is: Inpatient ?Remains inpatient appropriate because: Continued work-up/treatment of CHF and pneumonia ? ? ? ? ? ? ? ? ? Planned Discharge Destination: Home with Home Health ? ? ? ? ?Time spent: 35 minutes ? ?Author: ?Kathie Dike, MD ?12/08/2021 6:07 PM ? ?  For on call review [email or web address].  ? ?

## 2021-12-08 NOTE — Progress Notes (Signed)
2D echocardiogram with [facility] completed. ? ?12/08/2021 9:37 AM ?Eula Fried., MHA, RVT, RDCS, RDMS   ?

## 2021-12-08 NOTE — Hospital Course (Signed)
88 year old male with a history of hypertension, diabetes, hyperlipidemia, noted to the hospital with persistent cough and shortness of breath.  Initial work-up indicated pulmonary edema and decompensated CHF.  He had elevated BNP and chest x-ray findings consistent with CHF.  He received intravenous Lasix with good urine output and overall improvement of respiratory status.  He continues to have cough and was mildly febrile.  CT chest performed indicated possible bronchitis versus early bronchopneumonia.  He is started on antibiotics. ?

## 2021-12-08 NOTE — Evaluation (Signed)
Physical Therapy Evaluation ?Patient Details ?Name: Roy Ibarra ?MRN: [id number] ?DOB: 1922/12/15 ?Today's Date: 12/08/2021 ? ?History of Present Illness ? Roy Ibarra is a 88 y.o. male with medical history significant of hypertension, diabetes, chronic kidney disease stage IIIa, is brought to the hospital today with increasing fatigue, shortness of breath and cough.  History is obtained from the patient as well as his daughter.  Patient has had a cough for the past week, worse over the past 24 hours.  He has been feeling increasingly fatigued.  He has not had any fever.  No vomiting or diarrhea.  He normally ambulates, occasionally with a walker.  There was some concern that he may have an underlying infectious process since other family members that he was in contact with appears to have a respiratory illness.  He did not have any chest pain.  He was brought to the ER for evaluation where chest x-ray indicated pulmonary edema.  BNP noted to be elevated at 990.  Troponin mildly elevated at 20.  He received a dose of intravenous Lasix in the emergency room with good urine output and some improvement of his respiratory status.  He has been referred for admission. ?  ?Clinical Impression ? Patient limited for functional mobility as stated below secondary to BLE weakness, fatigue and poor standing balance. Patient requires assist and cueing to transition to seated EOB. He demonstrates good sitting tolerance and sitting balance. He requires several attempts along with cueing, assist, and RW to transfer to standing with unsteadiness upon standing. Patient completes standing marches and is limited to several lateral, forward and backward steps at bedside with RW. He is limited by fatigue. Patient's daughter states ability to continue assisting patient at home. Patient will benefit from continued physical therapy in hospital and recommended venue below to increase strength, balance, endurance for safe ADLs and  gait. ?   ?   ? ?Recommendations for follow up therapy are one component of a multi-disciplinary discharge planning process, led by the attending physician.  Recommendations may be updated based on patient status, additional functional criteria and insurance authorization. ? ?Follow Up Recommendations Home health PT ? ?  ?Assistance Recommended at Discharge Intermittent Supervision/Assistance  ?Patient can return home with the following ? A lot of help with walking and/or transfers;A lot of help with bathing/dressing/bathroom;Assistance with cooking/housework;Help with stairs or ramp for entrance ? ?  ?Equipment Recommendations Rolling walker (2 wheels);BSC/3in1  ?Recommendations for Other Services ?    ?  ?Functional Status Assessment Patient has had a recent decline in their functional status and demonstrates the ability to make significant improvements in function in a reasonable and predictable amount of time.  ? ?  ?Precautions / Restrictions Precautions ?Precautions: Fall ?Restrictions ?Weight Bearing Restrictions: No  ? ?  ? ?Mobility ? Bed Mobility ?Overal bed mobility: Needs Assistance ?Bed Mobility: Supine to Sit, Sit to Supine ?  ?  ?Supine to sit: Min assist, HOB elevated ?Sit to supine: Min guard ?  ?General bed mobility comments: slow, labored ?  ? ?Transfers ?Overall transfer level: Needs assistance ?Equipment used: Rolling walker (2 wheels) ?Transfers: Sit to/from Stand ?Sit to Stand: Mod assist ?  ?  ?  ?  ?  ?General transfer comment: cueing for sequencing and proper RW use, assist for weakness ?  ? ?Ambulation/Gait ?Ambulation/Gait assistance: Mod assist ?Gait Distance (Feet): 3 Feet ?Assistive device: Rolling walker (2 wheels) ?Gait Pattern/deviations: Shuffle ?Gait velocity: decreased ?  ?  ?General Gait Details: slow, labored  unsteady steps at bedside ? ?Stairs ?  ?  ?  ?  ?  ? ?Wheelchair Mobility ?  ? ?Modified Rankin (Stroke Patients Only) ?  ? ?  ? ?Balance Overall balance assessment: Needs  assistance ?  ?Sitting balance-Leahy Scale: Good ?Sitting balance - Comments: seated EOB ?  ?Standing balance support: Bilateral upper extremity supported ?Standing balance-Leahy Scale: Poor ?  ?  ?  ?  ?  ?  ?  ?  ?  ?  ?  ?  ?   ? ? ? ?Pertinent Vitals/Pain Pain Assessment ?Pain Assessment: No/denies pain  ? ? ?Home Living Family/patient expects to be discharged to:: Private residence ?Living Arrangements: Children ?Available Help at Discharge: Family ?Type of Home: House ?Home Access: Stairs to enter ?  ?Entrance Stairs-Number of Steps: 1 ?Alternate Level Stairs-Number of Steps: uses lift chair ?Home Layout: Multi-level ?Home Equipment: Shower seat;Cane - single point ?   ?  ?Prior Function Prior Level of Function : Needs assist ?  ?  ?  ?  ?  ?  ?Mobility Comments: household ambulation, uses walls for support PRN ?ADLs Comments: family assists ?  ? ? ?Hand Dominance  ?   ? ?  ?Extremity/Trunk Assessment  ?   ?  ? ?  ?  ? ?   ?Communication  ? Communication: HOH  ?Cognition Arousal/Alertness: Awake/alert ?Behavior During Therapy: [facility] for tasks assessed/performed ?Overall Cognitive Status: Within Functional Limits for tasks assessed ?  ?  ?  ?  ?  ?  ?  ?  ?  ?  ?  ?  ?  ?  ?  ?  ?  ?  ?  ? ?  ?General Comments   ? ?  ?Exercises General Exercises - Lower Extremity ?Hip Flexion/Marching: AROM, Both, 20 reps, Standing  ? ?Assessment/Plan  ?  ?PT Assessment Patient needs continued PT services  ?PT Problem List Decreased strength;Decreased mobility;Decreased activity tolerance;Decreased balance;Cardiopulmonary status limiting activity ? ?   ?  ?PT Treatment Interventions DME instruction;Therapeutic exercise;Gait training;Balance training;Stair training;Neuromuscular re-education;Functional mobility training;Therapeutic activities;Patient/family education   ? ?PT Goals (Current goals can be found in the Care Plan section)  ?Acute Rehab PT Goals ?Patient Stated Goal: Return home ?PT Goal Formulation: With  patient/family ?Time For Goal Achievement: 12/22/21 ?Potential to Achieve Goals: Good ? ?  ?Frequency Min 3X/week ?  ? ? ?Co-evaluation   ?  ?  ?  ?  ? ? ?  ?AM-PAC PT "6 Clicks" Mobility  ?Outcome Measure Help needed turning from your back to your side while in a flat bed without using bedrails?: None ?Help needed moving from lying on your back to sitting on the side of a flat bed without using bedrails?: A Little ?Help needed moving to and from a bed to a chair (including a wheelchair)?: A Lot ?Help needed standing up from a chair using your arms (e.g., wheelchair or bedside chair)?: A Lot ?Help needed to walk in hospital room?: A Lot ?Help needed climbing 3-5 steps with a railing? : A Lot ?6 Click Score: 15 ? ?  ?End of Session Equipment Utilized During Treatment: Gait belt ?Activity Tolerance: Patient limited by fatigue ?Patient left: in bed;with family/visitor present;with call bell/phone within reach;with bed alarm set ?Nurse Communication: Mobility status ?PT Visit Diagnosis: Unsteadiness on feet (R26.81);Other abnormalities of gait and mobility (R26.89);Muscle weakness (generalized) (M62.81) ?  ? ?Time: [id number] ?PT Time Calculation (min) (ACUTE ONLY): 23 min ? ? ?Charges:   PT  Evaluation ?$PT Eval Low Complexity: 1 Low ?PT Treatments ?$Therapeutic Activity: 8-22 mins ?  ?   ? ? ?11:34 AM, 12/08/21 ?Mearl Latin PT, DPT ?Physical Therapist at [facility] ?[facility] ? ? ?

## 2021-12-08 NOTE — Plan of Care (Signed)
?  Problem: Acute Rehab PT Goals(only PT should resolve) ?Goal: Pt Will Go Supine/Side To Sit ?Outcome: Progressing ?Flowsheets (Taken 12/08/2021 1136) ?Pt will go Supine/Side to Sit: with supervision ?Goal: Pt Will Go Sit To Supine/Side ?Outcome: Progressing ?Flowsheets (Taken 12/08/2021 1136) ?Pt will go Sit to Supine/Side: with supervision ?Goal: Patient Will Transfer Sit To/From Stand ?Outcome: Progressing ?Flowsheets (Taken 12/08/2021 1136) ?Patient will transfer sit to/from stand: ? with min guard assist ? with minimal assist ?Goal: Pt Will Transfer Bed To Chair/Chair To Bed ?Outcome: Progressing ?Flowsheets (Taken 12/08/2021 1136) ?Pt will Transfer Bed to Chair/Chair to Bed: ? min guard assist ? with min assist ?Goal: Pt/caregiver will Perform Home Exercise Program ?Outcome: Progressing ?Flowsheets (Taken 12/08/2021 1136) ?Pt/caregiver will Perform Home Exercise Program: ? For increased strengthening ? For improved balance ? With Supervision, verbal cues required/provided ? 11:37 AM, 12/08/21 ?Mearl Latin PT, DPT ?Physical Therapist at [facility] ?[facility] ? ?

## 2021-12-09 DIAGNOSIS — R531 Weakness: Secondary | ICD-10-CM

## 2021-12-09 LAB — CBC
HCT: 35.9 % — ABNORMAL LOW (ref 39.0–52.0)
Hemoglobin: 11.8 g/dL — ABNORMAL LOW (ref 13.0–17.0)
MCH: 28.7 pg (ref 26.0–34.0)
MCHC: 32.9 g/dL (ref 30.0–36.0)
MCV: 87.3 fL (ref 80.0–100.0)
Platelets: 202 10*3/uL (ref 150–400)
RBC: 4.11 MIL/uL — ABNORMAL LOW (ref 4.22–5.81)
RDW: 13.6 % (ref 11.5–15.5)
WBC: 9.2 10*3/uL (ref 4.0–10.5)
nRBC: 0 % (ref 0.0–0.2)

## 2021-12-09 LAB — BASIC METABOLIC PANEL
Anion gap: 9 (ref 5–15)
BUN: 32 mg/dL — ABNORMAL HIGH (ref 8–23)
CO2: 24 mmol/L (ref 22–32)
Calcium: 8.3 mg/dL — ABNORMAL LOW (ref 8.9–10.3)
Chloride: 98 mmol/L (ref 98–111)
Creatinine, Ser: 1.7 mg/dL — ABNORMAL HIGH (ref 0.61–1.24)
GFR, Estimated: 36 mL/min — ABNORMAL LOW (ref >60)
Glucose, Bld: 181 mg/dL — ABNORMAL HIGH (ref 70–99)
Potassium: 3.9 mmol/L (ref 3.5–5.1)
Sodium: 131 mmol/L — ABNORMAL LOW (ref 135–145)

## 2021-12-09 LAB — GLUCOSE, CAPILLARY
Glucose-Capillary: 173 mg/dL — ABNORMAL HIGH (ref 70–99)
Glucose-Capillary: 161 mg/dL — ABNORMAL HIGH (ref 70–99)
Glucose-Capillary: 142 mg/dL — ABNORMAL HIGH (ref 70–99)
Glucose-Capillary: 135 mg/dL — ABNORMAL HIGH (ref 70–99)

## 2021-12-09 LAB — HIV ANTIBODY (ROUTINE TESTING W REFLEX): HIV Screen 4th Generation wRfx: NONREACTIVE

## 2021-12-09 MED ORDER — CEPHALEXIN 500 MG PO CAPS: 500.0000 mg | ORAL_CAPSULE | Freq: Three times a day (TID) | ORAL | 0 refills | Status: AC

## 2021-12-09 MED ORDER — AZITHROMYCIN 250 MG PO TABS: ORAL_TABLET | ORAL | 0 refills | Status: DC

## 2021-12-09 MED ORDER — HYDROCODONE BIT-HOMATROP MBR 5-1.5 MG/5ML PO SOLN: 5.0000 mL | Freq: Four times a day (QID) | ORAL | 0 refills | Status: DC | PRN

## 2021-12-09 MED ORDER — ALBUTEROL SULFATE HFA 108 (90 BASE) MCG/ACT IN AERS: 2.0000 | INHALATION_SPRAY | RESPIRATORY_TRACT | 0 refills | Status: DC | PRN

## 2021-12-09 MED ORDER — GUAIFENESIN ER 600 MG PO TB12: 600.0000 mg | ORAL_TABLET | Freq: Two times a day (BID) | ORAL | 0 refills | Status: DC

## 2021-12-09 NOTE — Consult Note (Addendum)
Cardiology Consultation:   Patient ID: Roy Ibarra MRN: [id number]; DOB: 07-10-1923  Admit date: 12/07/2021 Date of Consult: 12/09/2021  PCP:  Janora Norlander, DO   [facility] Providers Cardiologist:  None        Patient Profile:   Roy Ibarra is a 88 y.o. male with a hx of CAD, HTN, HLD who is being seen 12/09/2021 for the evaluation of CHF  at the request of Dr. Roderic Palau.  History of Present Illness:   Roy Ibarra is a 88 yo male patient with history of HTN, HLD, DM2, CAD  DES LAD 11/2017, . He had 50% LAD disease beyond first septal perforator managed medically, no significant RCA or LCX disease - Echocardiogram on 11/19/2017 demonstrated mildly reduced left ventricular systolic function, LVEF 45 to 50%, mild concentric LVH, normal diastolic function for age, with "severe posteroinferior hypokinesis".  02/2020 echo LVEF 55-60%, no WMAs, grade II dd, low normal RV function, mild MR.   Patient admitted 12/07/21 with cough and dyspnea, BNP 995, CXR pulm edema treated with one dose IV lasix, echo EF 60-65% grade 1DD. He had a fever and productive cough, CT chest mild bronchial thickening may represent bronchitis/bronchopneumonia. Poor historian. Denies chest pain or other cardiac complaints. Originally from [place]. Lives here with daughter.   Past Medical History:  Diagnosis Date   CAD (coronary artery disease)    CKD (chronic kidney disease), stage III ([place])    Hyperlipidemia    Hypertension    Ischemic cardiomyopathy     Past Surgical History:  Procedure Laterality Date   INGUINAL HERNIA REPAIR     PROSTATE BIOPSY     STENT PLACEMENT VASCULAR ([place] [facility])  11/2017     Home Medications:  Prior to Admission medications   Medication Sig Start Date End Date Taking? Authorizing Provider  amLODipine (NORVASC) 2.5 MG tablet Take 1 tablet (2.5 mg total) by mouth daily. 05/31/21   Janora Norlander, DO  aspirin 81 MG chewable tablet Chew by mouth daily.    [provider]  blood glucose meter kit and supplies Dispense based on patient and insurance preference. Use up to four times daily as directed. (FOR ICD-10 E10.9, E11.9). 11/04/21   Janora Norlander, DO  dorzolamide-timolol (COSOPT) 22.3-6.8 MG/ML ophthalmic solution 1 drop 2 (two) times daily.    [provider]  glucose blood test strip Check BGs daily. E11.9 11/04/21   Janora Norlander, DO  Lancet Device MISC UAD to check BGs daily E11.9 11/04/21   Ronnie Doss M, DO  latanoprost (XALATAN) 0.005 % ophthalmic solution 1 drop at bedtime.    [provider]  Multiple Vitamin (MULTIVITAMIN) tablet Take 1 tablet by mouth daily.    [provider]  rosuvastatin (CRESTOR) 10 MG tablet Take 1 tablet (10 mg total) by mouth daily. 05/31/21   Janora Norlander, DO  valsartan (DIOVAN) 80 MG tablet Take 1 tablet (80 mg total) by mouth daily. 11/09/20   Erven Colla, DO    Inpatient Medications: Scheduled Meds:  amLODipine  2.5 mg Oral Daily   aspirin  81 mg Oral Daily   dorzolamide-timolol  1 drop Both Eyes BID   enoxaparin (LOVENOX) injection  40 mg Subcutaneous Q24H   guaiFENesin  600 mg Oral BID   insulin aspart  0-15 Units Subcutaneous TID WC   insulin aspart  0-5 Units Subcutaneous QHS   ipratropium-albuterol  3 mL Nebulization BID   latanoprost  1 drop Both Eyes QHS  rosuvastatin  10 mg Oral Daily   Continuous Infusions:  azithromycin 500 mg (12/08/21 1335)   cefTRIAXone (ROCEPHIN)  IV 2 g (12/08/21 1255)   PRN Meds: acetaminophen **OR** acetaminophen, albuterol, hydrALAZINE, HYDROcodone bit-homatropine, ondansetron **OR** ondansetron (ZOFRAN) IV  Allergies:    Allergies  Allergen Reactions   Ace Inhibitors Swelling    Social History:   Social History   Socioeconomic History   Marital status: Widowed    Spouse name: Not on file   Number of children: Not on file   Years of education: Not on file   Highest education level: Not on file   Occupational History   Not on file  Tobacco Use   Smoking status: Never   Smokeless tobacco: Never  Vaping Use   Vaping Use: Never used  Substance and Sexual Activity   Alcohol use: Not Currently   Drug use: Never   Sexual activity: Not Currently  Other Topics Concern   Not on file  Social History Narrative   Not on file   Social Determinants of Health   Financial Resource Strain: Not on file  Food Insecurity: Not on file  Transportation Needs: Not on file  Physical Activity: Not on file  Stress: Not on file  Social Connections: Not on file  Intimate Partner Violence: Not on file    Family History:     Family History  Problem Relation Age of Onset   Diabetes Brother    Diabetes Brother    Diabetes Brother      ROS:  Please see the history of present illness.  Poor historian Review of Systems  Unable to perform ROS: Other  Respiratory:  Positive for cough and shortness of breath.   Musculoskeletal:  Positive for arthritis.   All other ROS reviewed and negative.     Physical Exam/Data:   Vitals:   12/09/21 0443 12/09/21 0500 12/09/21 0758 12/09/21 0827  BP: 101/61   138/63  Pulse: 64     Resp: 17     Temp: (!) 97.4 F (36.3 C)     TempSrc: Oral     SpO2: 95%  96%   Weight:  83.8 kg    Height:        Intake/Output Summary (Last 24 hours) at 12/09/2021 0836 Last data filed at 12/09/2021 0411 Gross per 24 hour  Intake 590 ml  Output 750 ml  Net -160 ml   Last 3 Weights 12/09/2021 12/08/2021 12/07/2021  Weight (lbs) 184 lb 11.9 oz 186 lb 1.1 oz 180 lb  Weight (kg) 83.8 kg 84.4 kg 81.647 kg     Body mass index is 25.77 kg/m.  General:  Well nourished, well developed, in no acute distress  HEENT: normal Neck: no JVD Vascular: No carotid bruits; Distal pulses 2+ bilaterally Cardiac:  normal S1, S2; RRR; no murmur   Lungs:  decreased breath sounds with scattered wheezing  Abd: soft, nontender, no hepatomegaly  Ext: no edema Musculoskeletal:  No  deformities, BUE and BLE strength normal and equal Skin: warm and dry  Neuro:  CNs 2-12 intact, no focal abnormalities noted Psych:  Normal affect   EKG:  The EKG was personally reviewed and demonstrates:   Junctional tachycardia,Incomplete right bundle branch block,LVH with IVCD, LAD and secondary repol abnrm Telemetry:  Telemetry was personally reviewed and demonstrates:  Sinus bradycardia, NSR, one run of what looks like SVT 6 am today-short lived  Relevant CV Studies:   Echo 12/09/21 IMPRESSIONS     1.  Left ventricular ejection fraction, by estimation, is 60 to 65%. The  left ventricle has normal function. Left ventricular endocardial border  not optimally defined to evaluate regional wall motion. There is mild left  ventricular hypertrophy of the  basal-septal segment. Left ventricular diastolic parameters are consistent  with Grade I diastolic dysfunction (impaired relaxation).   2. Right ventricular systolic function is normal. The right ventricular  size is normal. There is normal pulmonary artery systolic pressure.   3. Left atrial size was moderately dilated.   4. Right atrial size was moderately dilated.   5. The mitral valve is normal in structure. Trivial mitral valve  regurgitation. No evidence of mitral stenosis.   6. The aortic valve is calcified. There is mild calcification of the  aortic valve. There is mild thickening of the aortic valve. Aortic valve  regurgitation is not visualized. Aortic valve sclerosis is present, with  no evidence of aortic valve stenosis.   7. The inferior vena cava is normal in size with greater than 50%  respiratory variability, suggesting right atrial pressure of 3 mmHg.   Comparison(s): Prior images reviewed side by side.  Laboratory Data:  High Sensitivity Troponin:   Recent Labs  Lab 12/07/21 0940  TROPONINIHS 21*     Chemistry Recent Labs  Lab 12/07/21 0940 12/08/21 0401 12/09/21 0423  NA 131* 132* 131*  K 4.7 3.9 3.9   CL 99 96* 98  CO2 _0 GLUCOSE 217* 168* 181*  BUN 23 23 32*  CREATININE 1.61* 1.50* 1.70*  CALCIUM 8.8* 8.6* 8.3*  GFRNONAA 39* 42* 36*  ANIONGAP _1 No results for input(s): PROT, ALBUMIN, AST, ALT, ALKPHOS, BILITOT in the last 168 hours. Lipids No results for input(s): CHOL, TRIG, HDL, LABVLDL, LDLCALC, CHOLHDL in the last 168 hours.  Hematology Recent Labs  Lab 12/07/21 0940 12/08/21 0401 12/09/21 0423  WBC 8.9 8.6 9.2  RBC 4.13* 4.52 4.11*  HGB 11.7* 12.4* 11.8*  HCT 36.6* 39.4 35.9*  MCV 88.6 87.2 87.3  MCH 28.3 27.4 28.7  MCHC 32.0 31.5 32.9  RDW 14.0 13.5 13.6  PLT 172 184 202   Thyroid  Recent Labs  Lab 12/08/21 0401  TSH 1.337    BNP Recent Labs  Lab 12/07/21 0940 12/08/21 0401  BNP 995.0* 611.0*    DDimer No results for input(s): DDIMER in the last 168 hours.   Radiology/Studies:  DG Chest 2 View  Result Date: 12/07/2021 CLINICAL DATA:  Shortness of breath EXAM: CHEST - 2 VIEW COMPARISON:  12/28/2009 FINDINGS: Bilateral mild interstitial thickening. No focal consolidation. No pleural effusion or pneumothorax. Mild cardiomegaly. No acute osseous abnormality. IMPRESSION: 1. Cardiomegaly with mild pulmonary vascular congestion. Electronically Signed   By: Kathreen Devoid M.D.   On: 12/07/2021 10:24   CT CHEST WO CONTRAST  Result Date: 12/08/2021 CLINICAL DATA:  Respiratory illness, nondiagnostic x-ray. EXAM: CT CHEST WITHOUT CONTRAST TECHNIQUE: Multidetector CT imaging of the chest was performed following the standard protocol without IV contrast. RADIATION DOSE REDUCTION: This exam was performed according to the departmental dose-optimization program which includes automated exposure control, adjustment of the mA and/or kV according to patient size and/or use of iterative reconstruction technique. COMPARISON:  CT angio chest dated Feb 28, 2010. FINDINGS: Cardiovascular: Heart is normal in size. Prominent coronary artery atherosclerotic  calcifications. Aorta is normal in caliber with atherosclerotic calcification of aortic arch. Main pulmonary trunk is normal in caliber. Mediastinum/Nodes: No enlarged mediastinal or axillary lymph  nodes. Thyroid gland, trachea, and esophagus demonstrate no significant findings. Lungs/Pleura: Mild bronchial thickening in the bilateral lower lobes. Bibasilar reticular opacities concerning for chronic mild fibrotic changes. No focal consolidation or pleural effusion. Upper Abdomen: Multiple bilateral large renal cysts. No acute abnormality Musculoskeletal: Moderate multilevel degenerative disease with dextroscoliosis. No acute osseous abnormality. IMPRESSION: 1. Mild bronchial thickening which may represent bronchitis/bronchopneumonia. No focal consolidation, pulmonary edema or pleural effusion. Mild bibasilar scarring/fibrosis. 2.  Atherosclerotic calcification of coronary arteries and aorta. 3. Multilevel degenerative disease of the thoracic spine with kyphoscoliosis. No acute osseous abnormality. Electronically Signed   By: Keane Police D.O.   On: 12/08/2021 13:10   DG Chest Port 1 View  Result Date: 12/07/2021 CLINICAL DATA:  Shortness of breath EXAM: PORTABLE CHEST 1 VIEW COMPARISON:  12/07/2021 FINDINGS: Cardiac shadow is mildly enlarged but stable. Tortuous thoracic aorta with calcifications is seen. Lungs are well aerated bilaterally. Minimal atelectasis in the left base is seen. No effusions are noted. Scoliosis of the thoracic spine is again noted. IMPRESSION: Mild left basilar atelectasis. Electronically Signed   By: Inez Catalina M.D.   On: 12/07/2021 23:12   ECHOCARDIOGRAM COMPLETE  Result Date: 12/08/2021    ECHOCARDIOGRAM REPORT   Patient Name:   Roy Ibarra Date of Exam: 12/08/2021 Medical Rec #:  [id number]        Height:       71.0 in Accession #:    [id number]       Weight:       186.1 lb Date of Birth:  1922/11/05       BSA:          2.045 m Patient Age:    35 years         BP:            144/70 mmHg Patient Gender: M                HR:           76 bpm. Exam Location:  Inpatient Procedure: 2D Echo, Cardiac Doppler, Color Doppler and Intracardiac            Opacification Agent Indications:    CHF  History:        Patient has prior history of Echocardiogram examinations, most                 recent 02/17/2020. CAD, Signs/Symptoms:Shortness of Breath; Risk                 Factors:Hypertension, Diabetes and Dyslipidemia.  Sonographer:    Maudry Mayhew MHA, RDMS, RVT, RDCS Referring Phys: [id number]  Sonographer Comments: Technically difficult study due to poor echo windows. Patient sitting upright secondary to cough and Image acquisition challenging due to respiratory motion. IMPRESSIONS  1. Left ventricular ejection fraction, by estimation, is 60 to 65%. The left ventricle has normal function. Left ventricular endocardial border not optimally defined to evaluate regional wall motion. There is mild left ventricular hypertrophy of the basal-septal segment. Left ventricular diastolic parameters are consistent with Grade I diastolic dysfunction (impaired relaxation).  2. Right ventricular systolic function is normal. The right ventricular size is normal. There is normal pulmonary artery systolic pressure.  3. Left atrial size was moderately dilated.  4. Right atrial size was moderately dilated.  5. The mitral valve is normal in structure. Trivial mitral valve regurgitation. No evidence of mitral stenosis.  6. The aortic valve is calcified. There is mild calcification of the  aortic valve. There is mild thickening of the aortic valve. Aortic valve regurgitation is not visualized. Aortic valve sclerosis is present, with no evidence of aortic valve stenosis.  7. The inferior vena cava is normal in size with greater than 50% respiratory variability, suggesting right atrial pressure of 3 mmHg. Comparison(s): Prior images reviewed side by side. FINDINGS  Left Ventricle: Left ventricular ejection  fraction, by estimation, is 60 to 65%. The left ventricle has normal function. Left ventricular endocardial border not optimally defined to evaluate regional wall motion. Definity contrast agent was given IV to delineate the left ventricular endocardial borders. The left ventricular internal cavity size was normal in size. There is mild left ventricular hypertrophy of the basal-septal segment. Left ventricular diastolic parameters are consistent with Grade I diastolic dysfunction (impaired relaxation). Right Ventricle: The right ventricular size is normal. No increase in right ventricular wall thickness. Right ventricular systolic function is normal. There is normal pulmonary artery systolic pressure. The tricuspid regurgitant velocity is 2.49 m/s, and  with an assumed right atrial pressure of 3 mmHg, the estimated right ventricular systolic pressure is 96.0 mmHg. Left Atrium: Left atrial size was moderately dilated. Right Atrium: Right atrial size was moderately dilated. Pericardium: There is no evidence of pericardial effusion. Mitral Valve: The mitral valve is normal in structure. Trivial mitral valve regurgitation. No evidence of mitral valve stenosis. Tricuspid Valve: The tricuspid valve is normal in structure. Tricuspid valve regurgitation is trivial. No evidence of tricuspid stenosis. Aortic Valve: The aortic valve is calcified. There is mild calcification of the aortic valve. There is mild thickening of the aortic valve. Aortic valve regurgitation is not visualized. Aortic valve sclerosis is present, with no evidence of aortic valve stenosis. Aortic valve mean gradient measures 4.0 mmHg. Aortic valve peak gradient measures 6.6 mmHg. Aortic valve area, by VTI measures 2.07 cm. Pulmonic Valve: The pulmonic valve was normal in structure. Pulmonic valve regurgitation is not visualized. No evidence of pulmonic stenosis. Aorta: The aortic root is normal in size and structure. Venous: The inferior vena cava is  normal in size with greater than 50% respiratory variability, suggesting right atrial pressure of 3 mmHg. IAS/Shunts: No atrial level shunt detected by color flow Doppler.  LEFT VENTRICLE PLAX 2D LVIDd:         3.80 cm   Diastology LVIDs:         2.70 cm   LV e' medial:   4.47 cm/s LV PW:         1.10 cm   LV E/e' medial: 12.4 LV IVS:        1.30 cm LVOT diam:     2.10 cm LV SV:         54 LV SV Index:   27 LVOT Area:     3.46 cm  RIGHT VENTRICLE RV S prime:     16.10 cm/s TAPSE (M-mode): 2.5 cm LEFT ATRIUM              Index        RIGHT ATRIUM           Index LA diam:        3.00 cm  1.47 cm/m   RA Area:     22.70 cm LA Vol (A2C):   59.4 ml  29.05 ml/m  RA Volume:   84.00 ml  41.08 ml/m LA Vol (A4C):   114.0 ml 55.75 ml/m LA Biplane Vol: 87.1 ml  42.60 ml/m  AORTIC VALVE AV Area (Vmax):  2.40 cm AV Area (Vmean):   2.26 cm AV Area (VTI):     2.07 cm AV Vmax:           128.00 cm/s AV Vmean:          86.900 cm/s AV VTI:            0.263 m AV Peak Grad:      6.6 mmHg AV Mean Grad:      4.0 mmHg LVOT Vmax:         88.60 cm/s LVOT Vmean:        56.800 cm/s LVOT VTI:          0.157 m LVOT/AV VTI ratio: 0.60  AORTA Ao Root diam: 3.20 cm MITRAL VALVE               TRICUSPID VALVE MV Area (PHT): 4.13 cm    TR Peak grad:   24.8 mmHg MV Decel Time: 184 msec    TR Vmax:        249.00 cm/s MR Peak grad: 5.7 mmHg MR Vmax:      119.00 cm/s  SHUNTS MV E velocity: 55.65 cm/s  Systemic VTI:  0.16 m MV A velocity: 72.90 cm/s  Systemic Diam: 2.10 cm MV E/A ratio:  0.76 Candee Furbish MD Electronically signed by Candee Furbish MD Signature Date/Time: 12/08/2021/11:26:50 AM    Final      Assessment and Plan:   Bronchitis/bronchopneumonia on IV rocephin per primary team  Acute on chronic CHF diuresed with 1 dose of lasix, echo normal LVEF 60-65% grade 1DD I/O's negative 790 cc. No fluid on board, now.   CAD DES LAD with 50% LAD beyond first septal perforated managed medically-on low dose amlodipine 2.5 mg daily, history of  bradycardia so no BB  Bradycardia/tachycardia-has not been on BB due to bradycardia and hypotension in past, one short run of possible SVT this am asymptomatic in setting of bronchopneumonia. If he becomes symptomatic could order outpatient ZIO but reassess in office f/u  HTN-BP low on amlodipine low dose. Valsartan on hold  HLD on statin  DM2 per primary team  CKD stage 3-Crt 1.70-valsartan on hold   Risk Assessment/Risk Scores:        New York Heart Association (NYHA) Functional Class NYHA Class II    [facility] will sign off.   Medication Recommendations:  current meds Other recommendations (labs, testing, etc):  none Follow up as an outpatient:  01/10/22 3:30 with Bernerd Pho, PA-C  For questions or updates, please contact [facility] Please consult [email or web address] for contact info under    Signed, Ermalinda Barrios, PA-C  12/09/2021 8:36 AM  88 y.o. from [place] . Admitted with clinical bronchitis and ongoing cough. Given lasix for minimally elevated BNP diuresed but no clinica Volume overload. Cough persists with CXR showing bronchitis and no CHF Cardiac status stable Agree with above   Jenkins Rouge MD [facility]

## 2021-12-09 NOTE — TOC Transition Note (Signed)
Transition of Care (TOC) - CM/SW Discharge Note ? ? ?Patient Details  ?Name: Tobechukwu Emmick ?MRN: [id number] ?Date of Birth: 1922/12/16 ? ?Transition of Care (TOC) CM/SW Contact:  ?Villa Herb, LCSWA ?Phone Number: ?12/09/2021, 12:40 PM ? ? ?Clinical Narrative:    ?CSW confirmed with [facility] rep Clifton Custard who confirms they can accept pt for [facility] PT services. CSW updated that pt would D/C home today. CSW reached out to [place], [place], [employer], [employer] and [employer]- non of which are in network with pts insurance. CSW spoke with pts daughter about option to purchase DME through [place] as it is likely most cost effective. CSW printed out DME options from [facility] for walker and BSC. Pts daughter states they would prefer to buy off [employer] than get from DME company out of pocket. [employer] sheets provided to daughter in room who will give to Ms. Simpson pts daguhter who will purchase equipment today. TOC singing off.  ? ?Final next level of care: Home w Home Health Services ?Barriers to Discharge: Barriers Resolved ? ? ?Patient Goals and CMS Choice ?Patient states their goals for this hospitalization and ongoing recovery are:: Home with HH ?CMS Medicare.gov Compare Post Acute Care list provided to:: Patient Represenative (must comment) ?Choice offered to / list presented to : Adult Children ? ?Discharge Placement ?  ?           ?  ?  ?  ?  ? ?Discharge Plan and Services ?In-house Referral: Clinical Social Work ?Discharge Planning Services: CM Consult ?Post Acute Care Choice: Home Health, Durable Medical Equipment          ?DME Arranged: 3-N-1, Walker rolling ?  ?  ?  ?  ?HH Arranged: PT ?HH Agency: [facility] ?  ?  ?Representative spoke with at [facility] Agency: Clifton Custard ? ?Social Determinants of Health (SDOH) Interventions ?  ? ? ?Readmission Risk Interventions ?Readmission Risk Prevention Plan 12/09/2021  ?Transportation Screening Complete  ?Home Care Screening Complete   ?Medication Review (RN CM) Complete  ?Some recent data might be hidden  ? ? ? ? ? ?

## 2021-12-09 NOTE — TOC Initial Note (Addendum)
Transition of Care (TOC) - Initial/Assessment Note  ? ? ?Patient Details  ?Name: Roy Ibarra ?MRN: [id number] ?Date of Birth: 1922-12-08 ? ?Transition of Care (TOC) CM/SW Contact:    ?Iona Beard, LCSWA ?Phone Number: ?12/09/2021, 9:58 AM ? ?Clinical Narrative:                 ?CSW completed pts assessment with his daughter Tula Nakayama who states that pt lives with her and her husband. Pt can walk in the home but has a caregiver 9 hours a day to assist with ADLs. CSW spoke with Ms. Simpson about [facility] services. They have had [facility] in the past and would like for [facility] PT services to continue with the same therapist. [place] reached out to [place] with [facility] who is reviewing the referral. PT has also recommenced a walker and BSC for pt. CSW spoke with Ms. Simpson about this, she would like for a walker to be ordered. She will call me back to see if they need the [facility] to be ordered. TOC to follow.  ? ?Expected Discharge Plan: [place] ?Barriers to Discharge: Continued Medical Work up ? ? ?Patient Goals and CMS Choice ?Patient states their goals for this hospitalization and ongoing recovery are:: Home with HH ?CMS Medicare.gov Compare Post Acute Care list provided to:: Patient Represenative (must comment) ?Choice offered to / list presented to : Adult Children ? ?Expected Discharge Plan and Services ?Expected Discharge Plan: [place] ?In-house Referral: Clinical Social Work ?Discharge Planning Services: CM Consult ?Post Acute Care Choice: Home Health, Durable Medical Equipment ?Living arrangements for the past 2 months: [place] ?                ?DME Arranged: 3-N-1, Walker rolling ?  ?  ?  ?  ?HH Arranged: PT ?[place] Agency: [place] ?  ?  ?  ? ?Prior Living Arrangements/Services ?Living arrangements for the past 2 months: [place] ?Lives with:: Adult Children ?Patient language and need for interpreter reviewed:: Yes ?Do you feel safe going  back to the place where you live?: Yes      ?Need for Family Participation in Patient Care: Yes (Comment) ?Care giver support system in place?: Yes (comment) ?Current home services: [facility] ?Criminal Activity/Legal Involvement Pertinent to Current Situation/Hospitalization: No - Comment as needed ? ?Activities of Daily Living ?Home Assistive Devices/Equipment: Gilford Rile (specify type), Eyeglasses, Cane (specify quad or straight), Shower chair with back ?ADL Screening (condition at time of admission) ?Patient's cognitive ability adequate to safely complete daily activities?: Yes ?Is the patient deaf or have difficulty hearing?: Yes ?Does the patient have difficulty seeing, even when wearing glasses/contacts?: Yes ?Does the patient have difficulty concentrating, remembering, or making decisions?: Yes ?Patient able to express need for assistance with ADLs?: Yes ?Does the patient have difficulty dressing or bathing?: Yes ?Independently performs ADLs?: Yes (appropriate for developmental age) ?Does the patient have difficulty walking or climbing stairs?: Yes ?Weakness of Legs: Both ?Weakness of Arms/Hands: Both ? ?Permission Sought/Granted ?  ?  ?   ?   ?   ?   ? ?Emotional Assessment ?Appearance:: Appears stated age ?Attitude/Demeanor/Rapport: Engaged ?Affect (typically observed): Accepting ?  ?Alcohol / Substance Use: Not Applicable ?Psych Involvement: No (comment) ? ?Admission diagnosis:  Acute CHF (congestive heart failure) ([place]) [I50.9] ?Acute congestive heart failure, unspecified heart failure type ([place]) [I50.9] ?Patient Active Problem List  ? Diagnosis Date Noted  ? Fever 12/08/2021  ?  Generalized weakness 12/08/2021  ? Acute diastolic CHF (congestive heart failure) ([place]) 12/07/2021  ? CKD (chronic kidney disease) stage 3, GFR 30-59 ml/min (HCC) 12/07/2021  ? Hyperlipidemia associated with type 2 diabetes mellitus ([place]) 05/31/2021  ? ORTHOSTATIC HYPOTENSION 01/17/2010  ? TACHYCARDIA 01/15/2010  ? CHEST PAIN-UNSPECIFIED  01/15/2010  ? Diabetes mellitus ([place]) 12/27/2009  ? Anemia 12/27/2009  ? Hypertension associated with diabetes ([place]) 12/27/2009  ? BPH (benign prostatic hyperplasia) 12/27/2009  ? ?PCP:  Janora Norlander, DO ?Pharmacy:   ?[facility] [place] [id number] ?[address] ?[place] [address] ?Phone: [phone] Fax: [phone] ? ? ? ? ?Social Determinants of Health (SDOH) Interventions ?  ? ?Readmission Risk Interventions ?Readmission Risk Prevention Plan 12/09/2021  ?Transportation Screening Complete  ?Home Care Screening Complete  ?Medication Review (RN CM) Complete  ?Some recent data might be hidden  ? ? ? ?

## 2021-12-09 NOTE — Progress Notes (Signed)
Patient discharged home today with home health, transported home by family. Discharge paperwork went over with patient and daughter, both verbalized understanding. Belongings sent home with patient.  ?

## 2021-12-09 NOTE — Discharge Summary (Signed)
Physician Discharge Summary   Patient: Roy Ibarra MRN: [id number] DOB: 1922/10/23  Admit date:     12/07/2021  Discharge date: 12/09/21  Discharge Physician: Kathie Dike   PCP: Janora Norlander, DO   Recommendations at discharge:   Follow-up with cardiology has been scheduled Follow-up with primary care physician in 1 to 2 weeks Consider repeat basic metabolic panel in 1 week to ensure stability of creatinine  Discharge Diagnoses: Principal Problem:   Acute diastolic CHF (congestive heart failure) (HCC) Active Problems:   Diabetes mellitus ([place])   Hypertension associated with diabetes ([place])   Hyperlipidemia associated with type 2 diabetes mellitus (HCC)   CKD (chronic kidney disease) stage 3, GFR 30-59 ml/min (HCC)   Fever   Generalized weakness  Resolved Problems:   * No resolved hospital problems. *  Hospital Course: 88 year old male with a history of hypertension, diabetes, hyperlipidemia, noted to the hospital with persistent cough and shortness of breath.  Initial work-up indicated pulmonary edema and decompensated CHF.  He had elevated BNP and chest x-ray findings consistent with CHF.  He received intravenous Lasix with good urine output and overall improvement of respiratory status.  He continues to have cough and was mildly febrile.  CT chest performed indicated possible bronchitis versus early bronchopneumonia.  He is started on antibiotics.  Assessment and Plan: * Acute diastolic CHF (congestive heart failure) (HCC) Presented with shortness of breath and cough BNP elevated 995> 611 Chest x-ray indicated pulmonary edema Received intravenous Lasix with good urine output Overall volume status appears to have improved He does not have significant lower extremity edema Echocardiogram updated and shows ejection fraction of 60 to 65% with grade 1 diastolic dysfunction Seen by cardiology who agreed with current management.  Felt that his shortness of breath may  be more related to bronchitis Follow-up with cardiology has been scheduled We will continue with Lasix as needed  Generalized weakness Seen by physical therapy with recommendations for home health  Fever Patient was noted to have a mild temperature overnight.   He continues to have a productive cough Initial chest x-ray did not comment on any pneumonia COVID test was negative CT chest showed developing bronchitis versus bronchopneumonia Started empirically on ceftriaxone and azithromycin He has been transitioned to oral azithromycin and Keflex to complete his course Continue mucolytic's and other supportive measures  CKD (chronic kidney disease) stage 3, GFR 30-59 ml/min (HCC) Baseline creatinine appears to be around 1.2-1.4 Continue to monitor creatinine in the setting of diuresis This did trend up to 1.7, likely related to diuresis and poor p.o. intake Encouraged increased fluid intake Can consider repeat labs in 1 week to ensure stability of renal function  Hyperlipidemia associated with type 2 diabetes mellitus ([place]) Continue statin  Hypertension associated with diabetes ([place]) Chronically on valsartan and Norvasc Hold valsartan for now with elevated creatinine Continue Norvasc We will use hydralazine as needed Blood pressures thus far appear to have been stable  Diabetes mellitus ([place]) Noted to be hyperglycemic on admission with a blood sugar 217 He is on dietary modifications A1c 8.2 He was monitored on sliding scale blood sugars remained stable Continue to monitor at home with dietary restrictions         Consultants: Cardiology Procedures performed: Echo Disposition: Home health Diet recommendation:  Discharge Diet Orders (From admission, onward)     Start     Ordered   12/09/21 0000  Diet - low sodium heart healthy        12/09/21  1151   12/09/21 0000  Diet - low sodium heart healthy        12/09/21 1151           Cardiac diet DISCHARGE  MEDICATION: Allergies as of 12/09/2021       Reactions   Ace Inhibitors Swelling        Medication List     STOP taking these medications    valsartan 80 MG tablet Commonly known as: Diovan       TAKE these medications    albuterol 108 (90 Base) MCG/ACT inhaler Commonly known as: VENTOLIN HFA Inhale 2 puffs into the lungs every 2 (two) hours as needed for wheezing or shortness of breath.   amLODipine 2.5 MG tablet Commonly known as: NORVASC Take 1 tablet (2.5 mg total) by mouth daily.   aspirin 81 MG chewable tablet Chew 81 mg by mouth daily.   azithromycin 250 MG tablet Commonly known as: ZITHROMAX Take 2 tablets po daily on day 1 and then 1 tablet daily until complete   blood glucose meter kit and supplies Dispense based on patient and insurance preference. Use up to four times daily as directed. (FOR ICD-10 E10.9, E11.9).   cephALEXin 500 MG capsule Commonly known as: KEFLEX Take 1 capsule (500 mg total) by mouth 3 (three) times daily for 5 days.   dorzolamide-timolol 22.3-6.8 MG/ML ophthalmic solution Commonly known as: COSOPT Place 1 drop into both eyes 2 (two) times daily.   glucose blood test strip Check BGs daily. E11.9   guaiFENesin 600 MG 12 hr tablet Commonly known as: MUCINEX Take 1 tablet (600 mg total) by mouth 2 (two) times daily.   HYDROcodone bit-homatropine 5-1.5 MG/5ML syrup Commonly known as: HYCODAN Take 5 mLs by mouth every 6 (six) hours as needed for cough.   Lancet Device Misc UAD to check BGs daily E11.9   latanoprost 0.005 % ophthalmic solution Commonly known as: XALATAN Place 1 drop into both eyes at bedtime.   multivitamin tablet Take 1 tablet by mouth daily.   rosuvastatin 10 MG tablet Commonly known as: Crestor Take 1 tablet (10 mg total) by mouth daily.               Durable Medical Equipment  (From admission, onward)           Start     Ordered   12/09/21 0000  For home use only DME Walker rolling        Question Answer Comment  Walker: With 5 Inch Wheels   Patient needs a walker to treat with the following condition Generalized weakness      12/09/21 1151   12/09/21 0000  DME Bedside commode       Question:  Patient needs a bedside commode to treat with the following condition  Answer:  Generalized weakness   12/09/21 1151            Discharge Exam: Filed Weights   12/07/21 0926 12/08/21 0600 12/09/21 0500  Weight: 81.6 kg 84.4 kg 83.8 kg   General exam: Alert, awake, oriented x 3 Respiratory system: Clear to auscultation. Respiratory effort normal. Cardiovascular system:RRR. No murmurs, rubs, gallops. Gastrointestinal system: Abdomen is nondistended, soft and nontender. No organomegaly or masses felt. Normal bowel sounds heard. Central nervous system: Alert and oriented. No focal neurological deficits. Extremities: No C/C/E, +pedal pulses Skin: No rashes, lesions or ulcers Psychiatry: Judgement and insight appear normal. Mood & affect appropriate.    Condition at discharge: good  The results of significant diagnostics from this hospitalization (including imaging, microbiology, ancillary and laboratory) are listed below for reference.   Imaging Studies: DG Chest 2 View  Result Date: 12/07/2021 CLINICAL DATA:  Shortness of breath EXAM: CHEST - 2 VIEW COMPARISON:  12/28/2009 FINDINGS: Bilateral mild interstitial thickening. No focal consolidation. No pleural effusion or pneumothorax. Mild cardiomegaly. No acute osseous abnormality. IMPRESSION: 1. Cardiomegaly with mild pulmonary vascular congestion. Electronically Signed   By: Kathreen Devoid M.D.   On: 12/07/2021 10:24   CT CHEST WO CONTRAST  Result Date: 12/08/2021 CLINICAL DATA:  Respiratory illness, nondiagnostic x-ray. EXAM: CT CHEST WITHOUT CONTRAST TECHNIQUE: Multidetector CT imaging of the chest was performed following the standard protocol without IV contrast. RADIATION DOSE REDUCTION: This exam was performed  according to the departmental dose-optimization program which includes automated exposure control, adjustment of the mA and/or kV according to patient size and/or use of iterative reconstruction technique. COMPARISON:  CT angio chest dated Feb 28, 2010. FINDINGS: Cardiovascular: Heart is normal in size. Prominent coronary artery atherosclerotic calcifications. Aorta is normal in caliber with atherosclerotic calcification of aortic arch. Main pulmonary trunk is normal in caliber. Mediastinum/Nodes: No enlarged mediastinal or axillary lymph nodes. Thyroid gland, trachea, and esophagus demonstrate no significant findings. Lungs/Pleura: Mild bronchial thickening in the bilateral lower lobes. Bibasilar reticular opacities concerning for chronic mild fibrotic changes. No focal consolidation or pleural effusion. Upper Abdomen: Multiple bilateral large renal cysts. No acute abnormality Musculoskeletal: Moderate multilevel degenerative disease with dextroscoliosis. No acute osseous abnormality. IMPRESSION: 1. Mild bronchial thickening which may represent bronchitis/bronchopneumonia. No focal consolidation, pulmonary edema or pleural effusion. Mild bibasilar scarring/fibrosis. 2.  Atherosclerotic calcification of coronary arteries and aorta. 3. Multilevel degenerative disease of the thoracic spine with kyphoscoliosis. No acute osseous abnormality. Electronically Signed   By: Keane Police D.O.   On: 12/08/2021 13:10   DG Chest Port 1 View  Result Date: 12/07/2021 CLINICAL DATA:  Shortness of breath EXAM: PORTABLE CHEST 1 VIEW COMPARISON:  12/07/2021 FINDINGS: Cardiac shadow is mildly enlarged but stable. Tortuous thoracic aorta with calcifications is seen. Lungs are well aerated bilaterally. Minimal atelectasis in the left base is seen. No effusions are noted. Scoliosis of the thoracic spine is again noted. IMPRESSION: Mild left basilar atelectasis. Electronically Signed   By: Inez Catalina M.D.   On: 12/07/2021 23:12    ECHOCARDIOGRAM COMPLETE  Result Date: 12/08/2021    ECHOCARDIOGRAM REPORT   Patient Name:   Roy Ibarra Date of Exam: 12/08/2021 Medical Rec #:  [id number]        Height:       71.0 in Accession #:    [id number]       Weight:       186.1 lb Date of Birth:  1923/07/07       BSA:          2.045 m Patient Age:    56 years         BP:           144/70 mmHg Patient Gender: M                HR:           76 bpm. Exam Location:  Inpatient Procedure: 2D Echo, Cardiac Doppler, Color Doppler and Intracardiac            Opacification Agent Indications:    CHF  History:        Patient has prior history of Echocardiogram examinations, most  recent 02/17/2020. CAD, Signs/Symptoms:Shortness of Breath; Risk                 Factors:Hypertension, Diabetes and Dyslipidemia.  Sonographer:    Maudry Mayhew MHA, RDMS, RVT, RDCS Referring Phys: [id number] [facility] Tracey Stewart  Sonographer Comments: Technically difficult study due to poor echo windows. Patient sitting upright secondary to cough and Image acquisition challenging due to respiratory motion. IMPRESSIONS  1. Left ventricular ejection fraction, by estimation, is 60 to 65%. The left ventricle has normal function. Left ventricular endocardial border not optimally defined to evaluate regional wall motion. There is mild left ventricular hypertrophy of the basal-septal segment. Left ventricular diastolic parameters are consistent with Grade I diastolic dysfunction (impaired relaxation).  2. Right ventricular systolic function is normal. The right ventricular size is normal. There is normal pulmonary artery systolic pressure.  3. Left atrial size was moderately dilated.  4. Right atrial size was moderately dilated.  5. The mitral valve is normal in structure. Trivial mitral valve regurgitation. No evidence of mitral stenosis.  6. The aortic valve is calcified. There is mild calcification of the aortic valve. There is mild thickening of the aortic valve. Aortic valve  regurgitation is not visualized. Aortic valve sclerosis is present, with no evidence of aortic valve stenosis.  7. The inferior vena cava is normal in size with greater than 50% respiratory variability, suggesting right atrial pressure of 3 mmHg. Comparison(s): Prior images reviewed side by side. FINDINGS  Left Ventricle: Left ventricular ejection fraction, by estimation, is 60 to 65%. The left ventricle has normal function. Left ventricular endocardial border not optimally defined to evaluate regional wall motion. Definity contrast agent was given IV to delineate the left ventricular endocardial borders. The left ventricular internal cavity size was normal in size. There is mild left ventricular hypertrophy of the basal-septal segment. Left ventricular diastolic parameters are consistent with Grade I diastolic dysfunction (impaired relaxation). Right Ventricle: The right ventricular size is normal. No increase in right ventricular wall thickness. Right ventricular systolic function is normal. There is normal pulmonary artery systolic pressure. The tricuspid regurgitant velocity is 2.49 m/s, and  with an assumed right atrial pressure of 3 mmHg, the estimated right ventricular systolic pressure is 16.5 mmHg. Left Atrium: Left atrial size was moderately dilated. Right Atrium: Right atrial size was moderately dilated. Pericardium: There is no evidence of pericardial effusion. Mitral Valve: The mitral valve is normal in structure. Trivial mitral valve regurgitation. No evidence of mitral valve stenosis. Tricuspid Valve: The tricuspid valve is normal in structure. Tricuspid valve regurgitation is trivial. No evidence of tricuspid stenosis. Aortic Valve: The aortic valve is calcified. There is mild calcification of the aortic valve. There is mild thickening of the aortic valve. Aortic valve regurgitation is not visualized. Aortic valve sclerosis is present, with no evidence of aortic valve stenosis. Aortic valve mean  gradient measures 4.0 mmHg. Aortic valve peak gradient measures 6.6 mmHg. Aortic valve area, by VTI measures 2.07 cm. Pulmonic Valve: The pulmonic valve was normal in structure. Pulmonic valve regurgitation is not visualized. No evidence of pulmonic stenosis. Aorta: The aortic root is normal in size and structure. Venous: The inferior vena cava is normal in size with greater than 50% respiratory variability, suggesting right atrial pressure of 3 mmHg. IAS/Shunts: No atrial level shunt detected by color flow Doppler.  LEFT VENTRICLE PLAX 2D LVIDd:         3.80 cm   Diastology LVIDs:         2.70 cm  LV e' medial:   4.47 cm/s LV PW:         1.10 cm   LV E/e' medial: 12.4 LV IVS:        1.30 cm LVOT diam:     2.10 cm LV SV:         54 LV SV Index:   27 LVOT Area:     3.46 cm  RIGHT VENTRICLE RV S prime:     16.10 cm/s TAPSE (M-mode): 2.5 cm LEFT ATRIUM              Index        RIGHT ATRIUM           Index LA diam:        3.00 cm  1.47 cm/m   RA Area:     22.70 cm LA Vol (A2C):   59.4 ml  29.05 ml/m  RA Volume:   84.00 ml  41.08 ml/m LA Vol (A4C):   114.0 ml 55.75 ml/m LA Biplane Vol: 87.1 ml  42.60 ml/m  AORTIC VALVE AV Area (Vmax):    2.40 cm AV Area (Vmean):   2.26 cm AV Area (VTI):     2.07 cm AV Vmax:           128.00 cm/s AV Vmean:          86.900 cm/s AV VTI:            0.263 m AV Peak Grad:      6.6 mmHg AV Mean Grad:      4.0 mmHg LVOT Vmax:         88.60 cm/s LVOT Vmean:        56.800 cm/s LVOT VTI:          0.157 m LVOT/AV VTI ratio: 0.60  AORTA Ao Root diam: 3.20 cm MITRAL VALVE               TRICUSPID VALVE MV Area (PHT): 4.13 cm    TR Peak grad:   24.8 mmHg MV Decel Time: 184 msec    TR Vmax:        249.00 cm/s MR Peak grad: 5.7 mmHg MR Vmax:      119.00 cm/s  SHUNTS MV E velocity: 55.65 cm/s  Systemic VTI:  0.16 m MV A velocity: 72.90 cm/s  Systemic Diam: 2.10 cm MV E/A ratio:  0.76 Candee Furbish MD Electronically signed by Candee Furbish MD Signature Date/Time: 12/08/2021/11:26:50 AM    Final      Microbiology: Results for orders placed or performed during the hospital encounter of 12/07/21  Resp Panel by RT-PCR (Flu A&B, Covid) Nasopharyngeal Swab     Status: None   Collection Time: 12/07/21 10:00 AM   Specimen: Nasopharyngeal Swab; Nasopharyngeal(NP) swabs in vial transport medium  Result Value Ref Range Status   SARS Coronavirus 2 by RT PCR NEGATIVE NEGATIVE Final    Comment: (NOTE) SARS-CoV-2 target nucleic acids are NOT DETECTED.  The SARS-CoV-2 RNA is generally detectable in upper respiratory specimens during the acute phase of infection. The lowest concentration of SARS-CoV-2 viral copies this assay can detect is 138 copies/mL. A negative result does not preclude SARS-Cov-2 infection and should not be used as the sole basis for treatment or other patient management decisions. A negative result may occur with  improper specimen collection/handling, submission of specimen other than nasopharyngeal swab, presence of viral mutation(s) within the areas targeted by this assay, and inadequate number of viral copies(<138 copies/mL).  A negative result must be combined with clinical observations, patient history, and epidemiological information. The expected result is Negative.  Fact Sheet for Patients:  EntrepreneurPulse.com.au  Fact Sheet for Healthcare Providers:  IncredibleEmployment.be  This test is no t yet approved or cleared by the [place] FDA and  has been authorized for detection and/or diagnosis of SARS-CoV-2 by FDA under an Emergency Use Authorization (EUA). This EUA will remain  in effect (meaning this test can be used) for the duration of the COVID-19 declaration under Section 564(b)(1) of the Act, 21 U.S.C.section 360bbb-3(b)(1), unless the authorization is terminated  or revoked sooner.       Influenza A by PCR NEGATIVE NEGATIVE Final   Influenza B by PCR NEGATIVE NEGATIVE Final    Comment: (NOTE) The Xpert  Xpress SARS-CoV-2/FLU/RSV plus assay is intended as an aid in the diagnosis of influenza from Nasopharyngeal swab specimens and should not be used as a sole basis for treatment. Nasal washings and aspirates are unacceptable for Xpert Xpress SARS-CoV-2/FLU/RSV testing.  Fact Sheet for Patients: EntrepreneurPulse.com.au  Fact Sheet for Healthcare Providers: IncredibleEmployment.be  This test is not yet approved or cleared by the [place] FDA and has been authorized for detection and/or diagnosis of SARS-CoV-2 by FDA under an Emergency Use Authorization (EUA). This EUA will remain in effect (meaning this test can be used) for the duration of the COVID-19 declaration under Section 564(b)(1) of the Act, 21 U.S.C. section 360bbb-3(b)(1), unless the authorization is terminated or revoked.  Performed at [facility], [address], [place],  [address]     Labs: CBC: Recent Labs  Lab 12/07/21 0940 12/08/21 0401 12/09/21 0423  WBC 8.9 8.6 9.2  NEUTROABS 5.3  --   --   HGB 11.7* 12.4* 11.8*  HCT 36.6* 39.4 35.9*  MCV 88.6 87.2 87.3  PLT 172 184 047   Basic Metabolic Panel: Recent Labs  Lab 12/07/21 0940 12/08/21 0401 12/09/21 0423  NA 131* 132* 131*  K 4.7 3.9 3.9  CL 99 96* 98  CO2 _0 GLUCOSE 217* 168* 181*  BUN 23 23 32*  CREATININE 1.61* 1.50* 1.70*  CALCIUM 8.8* 8.6* 8.3*   Liver Function Tests: No results for input(s): AST, ALT, ALKPHOS, BILITOT, PROT, ALBUMIN in the last 168 hours. CBG: Recent Labs  Lab 12/08/21 1609 12/08/21 2151 12/09/21 0745 12/09/21 1152 12/09/21 1620  GLUCAP 96 180* 161* 142* 135*    Discharge time spent: greater than 30 minutes.  Signed: Kathie Dike, MD [facility] 12/09/2021

## 2021-12-10 ENCOUNTER — Telehealth: Payer: Self-pay

## 2021-12-10 NOTE — Telephone Encounter (Signed)
Transition Care Management Unsuccessful Follow-up Telephone Call ? ?Date of discharge and from where:  [facility] 12-09-21 - acute CHF ? ?Attempts:  1st Attempt ? ?Reason for unsuccessful TCM follow-up call:  Left voice message ? ?  ?

## 2021-12-11 NOTE — Telephone Encounter (Signed)
Transition Care Management Unsuccessful Follow-up Telephone Call ? ?Date of discharge and from where: D/C [facility] ? ?Attempts:  2nd Attempt ? ?Reason for unsuccessful TCM follow-up call:  Left voice message ? ? ? ?

## 2021-12-16 NOTE — Telephone Encounter (Signed)
No TCM charge for this patient - daughter will not be able to bring him to appt until 3/24 which is after 14 days of d/c. She did say he is doing much better now ?

## 2021-12-27 ENCOUNTER — Encounter: Payer: Self-pay | Admitting: Family Medicine

## 2021-12-27 ENCOUNTER — Ambulatory Visit (INDEPENDENT_AMBULATORY_CARE_PROVIDER_SITE_OTHER): Payer: 59 | Admitting: Family Medicine

## 2021-12-27 VITALS — BP 138/66 | HR 62 | Temp 98.4°F | Ht 71.0 in | Wt 182.2 lb

## 2021-12-27 DIAGNOSIS — Z09 Encounter for follow-up examination after completed treatment for conditions other than malignant neoplasm: Secondary | ICD-10-CM | POA: Diagnosis not present

## 2021-12-27 DIAGNOSIS — N1831 Chronic kidney disease, stage 3a: Secondary | ICD-10-CM

## 2021-12-27 DIAGNOSIS — I5031 Acute diastolic (congestive) heart failure: Secondary | ICD-10-CM

## 2021-12-27 DIAGNOSIS — J208 Acute bronchitis due to other specified organisms: Secondary | ICD-10-CM

## 2021-12-27 DIAGNOSIS — B9689 Other specified bacterial agents as the cause of diseases classified elsewhere: Secondary | ICD-10-CM

## 2021-12-27 NOTE — Progress Notes (Signed)
? ?Subjective: ?CC: Hospital discharge follow-up ?PCP: Roy Norlander, DO ?AQT:[id number] [name] is a 88 y.o. male who is accompanied today's visit by his son-in-law and daughter.  He is presenting to clinic today for: ? ?1.  Hospital follow-up ?Patient had presented to the hospital for shortness of breath.  There was concern for CHF, diastolic, due to BNP that was quite elevated.  However, he presented with no pedal edema.  There were pulmonary infiltrates at that time it was thought to be diastolic heart failure.  He was diuresed.  Ultimately, it was felt that his symptoms were related to a bacterial bronchitis and he was empirically treated with oral antibiotics and cough suppressants.  After 2 days he was discharged from the hospital.  Cardiology did not feel that any additional interventions were needed but his ARB was discontinued in the hospital due to renal injury.  He did not require additional Lasix.  Blood pressures stayed consistently below 150/90 in the hospital.  He has remained compliant with Norvasc 2.5 mg daily.  They continue to work on diet modification to promote health and reduce blood sugars, which were noted to be uncontrolled at last visit.  He reports feeling fine.  He is currently working with physical therapy in the outpatient setting to strengthen.  He is utilizing a walker at home and for now he is going to require this due to unsteady gait.  His daughter notes that she often will see him holding onto the walls to steady himself ? ? ? ?ROS: Per HPI ? ?Allergies  ?Allergen Reactions  ? Ace Inhibitors Swelling  ? ?Past Medical History:  ?Diagnosis Date  ? CAD (coronary artery disease)   ? CKD (chronic kidney disease), stage III ([place])   ? Hyperlipidemia   ? Hypertension   ? Ischemic cardiomyopathy   ? ? ?Current Outpatient Medications:  ?  albuterol (VENTOLIN HFA) 108 (90 Base) MCG/ACT inhaler, Inhale 2 puffs into the lungs every 2 (two) hours as needed for wheezing or shortness of  breath., Disp: 18 g, Rfl: 0 ?  amLODipine (NORVASC) 2.5 MG tablet, Take 1 tablet (2.5 mg total) by mouth daily., Disp: 90 tablet, Rfl: 3 ?  aspirin 81 MG chewable tablet, Chew 81 mg by mouth daily., Disp: , Rfl:  ?  blood glucose meter kit and supplies, Dispense based on patient and insurance preference. Use up to four times daily as directed. (FOR ICD-10 E10.9, E11.9)., Disp: 1 each, Rfl: 0 ?  dorzolamide-timolol (COSOPT) 22.3-6.8 MG/ML ophthalmic solution, Place 1 drop into both eyes 2 (two) times daily., Disp: , Rfl:  ?  glucose blood test strip, Check BGs daily. E11.9, Disp: 100 each, Rfl: 12 ?  guaiFENesin (MUCINEX) 600 MG 12 hr tablet, Take 1 tablet (600 mg total) by mouth 2 (two) times daily., Disp: 30 tablet, Rfl: 0 ?  HYDROcodone bit-homatropine (HYCODAN) 5-1.5 MG/5ML syrup, Take 5 mLs by mouth every 6 (six) hours as needed for cough., Disp: 120 mL, Rfl: 0 ?  Lancet Device MISC, UAD to check BGs daily E11.9, Disp: 100 each, Rfl: 12 ?  latanoprost (XALATAN) 0.005 % ophthalmic solution, Place 1 drop into both eyes at bedtime., Disp: , Rfl:  ?  Multiple Vitamin (MULTIVITAMIN) tablet, Take 1 tablet by mouth daily., Disp: , Rfl:  ?  rosuvastatin (CRESTOR) 10 MG tablet, Take 1 tablet (10 mg total) by mouth daily., Disp: 90 tablet, Rfl: 3 ?Social History  ? ?Socioeconomic History  ? Marital status: Widowed  ?  Spouse name: Not on file  ? Number of children: Not on file  ? Years of education: Not on file  ? Highest education level: Not on file  ?Occupational History  ? Not on file  ?Tobacco Use  ? Smoking status: Never  ? Smokeless tobacco: Never  ?Vaping Use  ? Vaping Use: Never used  ?Substance and Sexual Activity  ? Alcohol use: Not Currently  ? Drug use: Never  ? Sexual activity: Not Currently  ?Other Topics Concern  ? Not on file  ?Social History Narrative  ? Not on file  ? ?Social Determinants of Health  ? ?Financial Resource Strain: Not on file  ?Food Insecurity: Not on file  ?Transportation Needs: Not on  file  ?Physical Activity: Not on file  ?Stress: Not on file  ?Social Connections: Not on file  ?Intimate Partner Violence: Not on file  ? ?Family History  ?Problem Relation Age of Onset  ? Diabetes Brother   ? Diabetes Brother   ? Diabetes Brother   ? ? ?Objective: ?Office vital signs reviewed. ?BP 138/66   Pulse 62   Temp 98.4 ?F (36.9 ?C)   Ht '5\' 11"'  (1.803 m)   Wt 182 lb 3.2 oz (82.6 kg)   SpO2 99%   BMI 25.41 kg/m?  ? ?Physical Examination:  ?General: Awake, alert, nontoxic appearing, No acute distress ?HEENT: sclera white, MMM ?Cardio: regular rate and rhythm, S1S2 heard, no murmurs appreciated ?Pulm: mildly coarse breath sounds on the right. Normal WOB on room air. No wheezes ?Extremities: warm, well perfused, No edema, cyanosis or clubbing; +2 pulses bilaterally ?MSK: arrives in wheelchair ? ?Assessment/ Plan: ?88 y.o. male  ? ?Acute bacterial bronchitis ? ?Hospital discharge follow-up ? ?Acute diastolic CHF (congestive heart failure) ([place]) - Plan: Basic Metabolic Panel ? ?Stage 3a chronic kidney disease ([place]) - Plan: Basic Metabolic Panel ? ?Nontoxic-appearing male with no evidence of fluid overload.  Will recheck BMP given AKI noted in the hospital.  Currently not on any diuretics.  Blood pressure was a little difficult to obtain but with manual recheck he was normal range on Norvasc 2.5 mg daily.  We will continue to monitor, particularly as he gets back to his baseline and can consider additional dosing if needed at that point.  He may follow-up for his normal checkup for diabetes.  Continue diet modification.  If not at goal at next visit we will plan to add medication ? ?No orders of the defined types were placed in this encounter. ? ?No orders of the defined types were placed in this encounter. ? ? ?Roy Norlander, DO ?[place] ?([phone] ? ? ? ? ?

## 2021-12-28 LAB — BASIC METABOLIC PANEL
BUN/Creatinine Ratio: 17 (ref 10–24)
BUN: 23 mg/dL (ref 10–36)
CO2: 22 mmol/L (ref 20–29)
Calcium: 9.3 mg/dL (ref 8.6–10.2)
Chloride: 98 mmol/L (ref 96–106)
Creatinine, Ser: 1.38 mg/dL — ABNORMAL HIGH (ref 0.76–1.27)
Glucose: 132 mg/dL — ABNORMAL HIGH (ref 70–99)
Potassium: 4.6 mmol/L (ref 3.5–5.2)
Sodium: 134 mmol/L (ref 134–144)
eGFR: 47 mL/min/{1.73_m2} — ABNORMAL LOW (ref >59)

## 2021-12-31 ENCOUNTER — Other Ambulatory Visit: Payer: Self-pay

## 2021-12-31 ENCOUNTER — Ambulatory Visit (INDEPENDENT_AMBULATORY_CARE_PROVIDER_SITE_OTHER): Payer: 59

## 2021-12-31 DIAGNOSIS — E1159 Type 2 diabetes mellitus with other circulatory complications: Secondary | ICD-10-CM | POA: Diagnosis not present

## 2021-12-31 DIAGNOSIS — N1831 Chronic kidney disease, stage 3a: Secondary | ICD-10-CM

## 2021-12-31 DIAGNOSIS — I509 Heart failure, unspecified: Secondary | ICD-10-CM | POA: Diagnosis not present

## 2021-12-31 DIAGNOSIS — E785 Hyperlipidemia, unspecified: Secondary | ICD-10-CM

## 2021-12-31 DIAGNOSIS — I255 Ischemic cardiomyopathy: Secondary | ICD-10-CM

## 2021-12-31 DIAGNOSIS — I2510 Atherosclerotic heart disease of native coronary artery without angina pectoris: Secondary | ICD-10-CM

## 2021-12-31 DIAGNOSIS — I152 Hypertension secondary to endocrine disorders: Secondary | ICD-10-CM

## 2021-12-31 DIAGNOSIS — E1122 Type 2 diabetes mellitus with diabetic chronic kidney disease: Secondary | ICD-10-CM

## 2021-12-31 DIAGNOSIS — E1165 Type 2 diabetes mellitus with hyperglycemia: Secondary | ICD-10-CM

## 2021-12-31 DIAGNOSIS — E1169 Type 2 diabetes mellitus with other specified complication: Secondary | ICD-10-CM

## 2022-01-10 ENCOUNTER — Ambulatory Visit: Payer: 59 | Admitting: Student

## 2022-02-03 ENCOUNTER — Ambulatory Visit: Payer: Self-pay | Admitting: Family Medicine

## 2022-02-03 LAB — HM DIABETES EYE EXAM

## 2022-03-07 ENCOUNTER — Encounter: Payer: Self-pay | Admitting: Cardiology

## 2022-03-10 ENCOUNTER — Ambulatory Visit: Payer: Self-pay | Admitting: Cardiology

## 2022-03-10 NOTE — Progress Notes (Deleted)
Clinical Summary Roy Ibarra is a 88 y.o.male seen today for follow up of the following medical problems.     1.CAD - history of DES to LAD in 11/2017. Had 50% LAD disease beyond first septal perforator managed medically, no significant RCA or LCX disease - Echocardiogram on 11/19/2017 demonstrated mildly reduced left ventricular systolic function, LVEF 45 to 50%, mild concentric LVH, normal diastolic function for age, with "severe posteroinferior hypokinesis".     02/2020 echo LVEF 55-60%, no WMAs, grade II dd, low normal RV function, mild MR.    - no recent chest pain. Chronic SOB unchanged - compliant with meds   2.HTN - from notes have accepted higher range given advanced age, dizzienss - previously had lightheadedness on valsartan.  - pcp recently started norvasc 2.43m daily   - home sbps 150s   3. Hyperlipidemia - he is on crestor 137mdaily - 11/2020 TC 101 TG 52 HDL 50 LDL 38 (was on atorva 4058mt that time)     4. Diastolic HF - admit 3/29/5072th bronchitis. In this setting evidence of fluid overload. BNP 995. CXR mild congestion - 12/2021 echo LVEF 60-65%, grade I dd,      Soc Hx: His wife died in Apr2019-04-26e is a retired [employer]er, television/film setis daughter is Dr. MarTula Nakayama Past Medical History:  Diagnosis Date   CAD (coronary artery disease)    CKD (chronic kidney disease), stage III ([facility])    Hyperlipidemia    Hypertension    Ischemic cardiomyopathy      Allergies  Allergen Reactions   Ace Inhibitors Swelling     Current Outpatient Medications  Medication Sig Dispense Refill   albuterol (VENTOLIN HFA) 108 (90 Base) MCG/ACT inhaler Inhale 2 puffs into the lungs every 2 (two) hours as needed for wheezing or shortness of breath. 18 g 0   amLODipine (NORVASC) 2.5 MG tablet Take 1 tablet (2.5 mg total) by mouth daily. 90 tablet 3   aspirin 81 MG chewable tablet Chew 81 mg by mouth daily.     blood glucose meter kit and supplies Dispense based  on patient and insurance preference. Use up to four times daily as directed. (FOR ICD-10 E10.9, E11.9). 1 each 0   dorzolamide-timolol (COSOPT) 22.3-6.8 MG/ML ophthalmic solution Place 1 drop into both eyes 2 (two) times daily.     glucose blood test strip Check BGs daily. E11.9 100 each 12   guaiFENesin (MUCINEX) 600 MG 12 hr tablet Take 1 tablet (600 mg total) by mouth 2 (two) times daily. 30 tablet 0   HYDROcodone bit-homatropine (HYCODAN) 5-1.5 MG/5ML syrup Take 5 mLs by mouth every 6 (six) hours as needed for cough. 120 mL 0   Lancet Device MISC UAD to check BGs daily E11.9 100 each 12   latanoprost (XALATAN) 0.005 % ophthalmic solution Place 1 drop into both eyes at bedtime.     Multiple Vitamin (MULTIVITAMIN) tablet Take 1 tablet by mouth daily.     rosuvastatin (CRESTOR) 10 MG tablet Take 1 tablet (10 mg total) by mouth daily. 90 tablet 3   No current facility-administered medications for this visit.     Past Surgical History:  Procedure Laterality Date   INGUINAL HERNIA REPAIR     PROSTATE BIOPSY     [place]  11[place]2017     Allergies  Allergen Reactions   Ace Inhibitors Swelling      Family History  Problem Relation  Age of Onset   Diabetes Brother    Diabetes Brother    Diabetes Brother      Social History Roy Ibarra reports that he has never smoked. He has never used smokeless tobacco. Roy Ibarra reports that he does not currently use alcohol.   Review of Systems CONSTITUTIONAL: No weight loss, fever, chills, weakness or fatigue.  HEENT: Eyes: No visual loss, blurred vision, double vision or yellow sclerae.No hearing loss, sneezing, congestion, runny nose or sore throat.  SKIN: No rash or itching.  CARDIOVASCULAR:  RESPIRATORY: No shortness of breath, cough or sputum.  GASTROINTESTINAL: No anorexia, nausea, vomiting or diarrhea. No abdominal pain or blood.  GENITOURINARY: No burning on urination, no polyuria NEUROLOGICAL: No  headache, dizziness, syncope, paralysis, ataxia, numbness or tingling in the extremities. No change in bowel or bladder control.  MUSCULOSKELETAL: No muscle, back pain, joint pain or stiffness.  LYMPHATICS: No enlarged nodes. No history of splenectomy.  PSYCHIATRIC: No history of depression or anxiety.  ENDOCRINOLOGIC: No reports of sweating, cold or heat intolerance. No polyuria or polydipsia.  Marland Kitchen   Physical Examination There were no vitals filed for this visit. There were no vitals filed for this visit.  Gen: resting comfortably, no acute distress HEENT: no scleral icterus, pupils equal round and reactive, no palptable cervical adenopathy,  CV Resp: Clear to auscultation bilaterally GI: abdomen is soft, non-tender, non-distended, normal bowel sounds, no hepatosplenomegaly MSK: extremities are warm, no edema.  Skin: warm, no rash Neuro:  no focal deficits Psych: appropriate affect   Diagnostic Studies     Assessment and Plan   CAD - no recent symptmos, continue current meds EKG today shows SR, chronic ST/T changes   2. HTN - reasonable control given advanced age and prior dizziness, continue current therapy   3. Hyperlipidemia - at goal, continue current meds     Arnoldo Lenis, M.D., F.A.C.C.

## 2022-03-24 ENCOUNTER — Other Ambulatory Visit: Payer: Self-pay | Admitting: Nurse Practitioner

## 2022-03-24 ENCOUNTER — Ambulatory Visit (INDEPENDENT_AMBULATORY_CARE_PROVIDER_SITE_OTHER): Payer: 59 | Admitting: Nurse Practitioner

## 2022-03-24 ENCOUNTER — Telehealth: Payer: Self-pay | Admitting: Family Medicine

## 2022-03-24 ENCOUNTER — Encounter: Payer: Self-pay | Admitting: Nurse Practitioner

## 2022-03-24 ENCOUNTER — Other Ambulatory Visit: Payer: Self-pay

## 2022-03-24 VITALS — Temp 101.6°F

## 2022-03-24 DIAGNOSIS — J069 Acute upper respiratory infection, unspecified: Secondary | ICD-10-CM | POA: Diagnosis not present

## 2022-03-24 DIAGNOSIS — U071 COVID-19: Secondary | ICD-10-CM

## 2022-03-24 DIAGNOSIS — R509 Fever, unspecified: Secondary | ICD-10-CM | POA: Diagnosis not present

## 2022-03-24 MED ORDER — GUAIFENESIN ER 600 MG PO TB12: 600.0000 mg | ORAL_TABLET | Freq: Two times a day (BID) | ORAL | 0 refills | Status: DC

## 2022-03-24 MED ORDER — ACETAMINOPHEN 500 MG PO TABS: 500.0000 mg | ORAL_TABLET | Freq: Four times a day (QID) | ORAL | 0 refills | Status: AC | PRN

## 2022-03-24 MED ORDER — DOXYCYCLINE HYCLATE 100 MG PO TABS: 100.0000 mg | ORAL_TABLET | Freq: Two times a day (BID) | ORAL | 0 refills | Status: DC

## 2022-03-24 NOTE — Patient Instructions (Signed)
Fever, Adult     A fever is an increase in your body's temperature. It often means a temperature of 100.29F (38C) or higher. Brief mild or moderate fevers often have no long-term effects. They often do not need treatment. Moderate or high fevers may make you feel uncomfortable. Sometimes, they can be a sign of a serious illness or disease. A fever that keeps coming back or that lasts a long time may cause you to lose water in your body (get dehydrated). You can take your temperature with a thermometer to see if you have a fever. Temperature can change with: Age. Time of day. Where the thermometer is put in the body. Readings may vary when the thermometer is put: In the mouth (oral). In the butt (rectal). In the ear (tympanic). Under the arm (axillary). On the forehead (temporal). Follow these instructions at home: Medicines Take over-the-counter and prescription medicines only as told by your doctor. Follow the dosing instructions carefully. If you were prescribed an antibiotic medicine, take it as told by your doctor. Do not stop taking it even if you start to feel better. General instructions Watch for any changes in your symptoms. Tell your doctor about them. Rest as needed. Drink enough fluid to keep your pee (urine) pale yellow. Sponge yourself or bathe with room-temperature water as needed. This helps to lower your body temperature. Do not use ice water. Do not use too many blankets or wear clothes that are too heavy. If your fever was caused by an infection that spreads from person to person (is contagious), such as a cold or the flu: You should stay home from work and public places for at least 24 hours after your fever is gone. Your fever should be gone for at least 24 hours without the need to use medicines. Contact a doctor if: You throw up (vomit). You cannot eat or drink without throwing up. You have watery poop (diarrhea). It hurts when you pee. Your symptoms do not get  better with treatment. You have new symptoms. You feel very weak. Get help right away if: You are short of breath or have trouble breathing. You are dizzy or you pass out (faint). You feel mixed up (confused). You have signs of not having enough water in your body, such as: Dark pee, very little pee, or no pee. Cracked lips. Dry mouth. Sunken eyes. Sleepiness. Weakness. You have very bad pain in your belly (abdomen). You keep throwing up or having watery poop. You have a rash on your skin. Your symptoms get worse all of a sudden. Summary A fever is an increase in your body's temperature. It often means a temperature of 100.29F (38C) or higher. Watch for any changes in your symptoms. Tell your doctor about them. Take all medicines only as told by your doctor. Do not go to work or other public places if your fever was caused by an illness that can spread to other people. Get help right away if you have signs that you do not have enough water in your body. This information is not intended to replace advice given to you by your health care provider. Make sure you discuss any questions you have with your health care provider. Document Revised: 02/12/2021 Document Reviewed: 02/12/2021 Elsevier Patient Education  2023 [employer] Upper Respiratory Infection, Adult An upper respiratory infection (URI) affects the nose, throat, and upper airways that lead to the lungs. The most common type of URI is often called the common cold. URIs  usually get better on their own, without medical treatment. What are the causes? A URI is caused by a germ (virus). You may catch these germs by: Breathing in droplets from an infected person's cough or sneeze. Touching something that has the germ on it (is contaminated) and then touching your mouth, nose, or eyes. What increases the risk? You are more likely to get a URI if: You are very young or very old. You have close contact with others, such as at  work, school, or a health care facility. You smoke. You have long-term (chronic) heart or lung disease. You have a weakened disease-fighting system (immune system). You have nasal allergies or asthma. You have a lot of stress. You have poor nutrition. What are the signs or symptoms? Runny or stuffy (congested) nose. Cough. Sneezing. Sore throat. Headache. Feeling tired (fatigue). Fever. Not wanting to eat as much as usual. Pain in your forehead, behind your eyes, and over your cheekbones (sinus pain). Muscle aches. Redness or irritation of the eyes. Pressure in the ears or face. How is this treated? URIs usually get better on their own within 7-10 days. Medicines cannot cure URIs, but your doctor may recommend certain medicines to help relieve symptoms, such as: Over-the-counter cold medicines. Medicines to reduce coughing (cough suppressants). Coughing is a type of defense against infection that helps to clear the nose, throat, windpipe, and lungs (respiratory system). Take these medicines only as told by your doctor. Medicines to lower your fever. Follow these instructions at home: Activity Rest as needed. If you have a fever, stay home from work or school until your fever is gone, or until your doctor says you may return to work or school. You should stay home until you cannot spread the infection anymore (you are not contagious). Your doctor may have you wear a face mask so you have less risk of spreading the infection. Relieving symptoms Rinse your mouth often with salt water. To make salt water, dissolve -1 tsp (3-6 g) of salt in 1 cup (237 mL) of warm water. Use a cool-mist humidifier to add moisture to the air. This can help you breathe more easily. Eating and drinking  Drink enough fluid to keep your pee (urine) pale yellow. Eat soups and other clear broths. General instructions  Take over-the-counter and prescription medicines only as told by your doctor. Do not  smoke or use any products that contain nicotine or tobacco. If you need help quitting, ask your doctor. Avoid being where people are smoking (avoid secondhand smoke). Stay up to date on all your shots (immunizations), and get the flu shot every year. Keep all follow-up visits. How to prevent the spread of infection to others  Wash your hands with soap and water for at least 20 seconds. If you cannot use soap and water, use hand sanitizer. Avoid touching your mouth, face, eyes, or nose. Cough or sneeze into a tissue or your sleeve or elbow. Do not cough or sneeze into your hand or into the air. Contact a doctor if: You are getting worse, not better. You have any of these: A fever or chills. Brown or red mucus in your nose. Yellow or brown fluid (discharge)coming from your nose. Pain in your face, especially when you bend forward. Swollen neck glands. Pain when you swallow. White areas in the back of your throat. Get help right away if: You have shortness of breath that gets worse. You have very bad or constant: Headache. Ear pain. Pain in your  forehead, behind your eyes, and over your cheekbones (sinus pain). Chest pain. You have long-lasting (chronic) lung disease along with any of these: Making high-pitched whistling sounds when you breathe, most often when you breathe out (wheezing). Long-lasting cough (more than 14 days). Coughing up blood. A change in your usual mucus. You have a stiff neck. You have changes in your: Vision. Hearing. Thinking. Mood. These symptoms may be an emergency. Get help right away. Call 911. Do not wait to see if the symptoms will go away. Do not drive yourself to the hospital. Summary An upper respiratory infection (URI) is caused by a germ (virus). The most common type of URI is often called the common cold. URIs usually get better within 7-10 days. Take over-the-counter and prescription medicines only as told by your doctor. This information  is not intended to replace advice given to you by your health care provider. Make sure you discuss any questions you have with your health care provider. Document Revised: 04/24/2021 Document Reviewed: 04/24/2021 Elsevier Patient Education  2023 [employer].

## 2022-03-24 NOTE — Addendum Note (Signed)
Addended by: Magdalene River on: 03/24/2022 03:22 PM   Modules accepted: Orders

## 2022-03-24 NOTE — Telephone Encounter (Signed)
I called family and spoke to patients daughter who will get back to me by morning. Considering treating patient with antiviral

## 2022-03-24 NOTE — Progress Notes (Signed)
Virtual Visit  Note Due to COVID-19 pandemic this visit was conducted virtually. This visit type was conducted due to national recommendations for restrictions regarding the COVID-19 Pandemic (e.g. social distancing, sheltering in place) in an effort to limit this patient's exposure and mitigate transmission in our community. All issues noted in this document were discussed and addressed.  A physical exam was not performed with this format.  I connected with Roy Ibarra on 03/24/22 at 10:24 by telephone and verified that I am speaking with the correct person using two identifiers. Roy Ibarra is currently located at home with daughter present  during visit. The provider, Daryll Drown, NP is located in their office at time of visit.  I discussed the limitations, risks, security and privacy concerns of performing an evaluation and management service by telephone and the availability of in person appointments. I also discussed with the patient that there may be a patient responsible charge related to this service. The patient expressed understanding and agreed to proceed.   History and Present Illness:  Fever  This is a new problem. The current episode started yesterday. The problem occurs intermittently. The problem has been unchanged. The maximum temperature noted was 101 to 101.9 F. Associated symptoms include congestion and coughing. Pertinent negatives include no nausea, sore throat or urinary pain. Treatments tried: OTC cough suppressant. The treatment provided no relief.  URI  This is a new problem. Episode onset: In the past 3 to 4 days. The problem has been gradually worsening. The maximum temperature recorded prior to his arrival was 101 - 101.9 F. Associated symptoms include congestion and coughing. Pertinent negatives include no dysuria, nausea or sore throat. Treatments tried: OTC cough syrup. The treatment provided mild relief.      Review of Systems  Constitutional:   Positive for fever.  HENT:  Positive for congestion. Negative for sore throat.   Respiratory:  Positive for cough.   Gastrointestinal:  Negative for nausea.  Genitourinary:  Negative for dysuria.     Observations/Objective: Televisit patient not in distress  Assessment and Plan: Family declines chest x-ray to rule out pneumonia. Active weight cough and fever of 101.6 present  Education provided to patient/daughter to provide continuous monitoring and take patient to hospital if symptoms becomes worse or unresolved.  Take meds as prescribed - Use a cool mist humidifier  -Use saline nose sprays frequently -Force fluids -For fever or aches or pains- take Tylenol or ibuprofen. -Doxycycline 100 mg tablet by mouth twice daily for 5 days -Mucinex for cough and congestion -If symptoms do not improve, he may need to be COVID tested to rule this out Follow up with worsening unresolved symptoms   Follow Up Instructions: Follow-up for worsening or unresolved symptoms.    I discussed the assessment and treatment plan with the patient. The patient was provided an opportunity to ask questions and all were answered. The patient agreed with the plan and demonstrated an understanding of the instructions.   The patient was advised to call back or seek an in-person evaluation if the symptoms worsen or if the condition fails to improve as anticipated.  The above assessment and management plan was discussed with the patient. The patient verbalized understanding of and has agreed to the management plan. Patient is aware to call the clinic if symptoms persist or worsen. Patient is aware when to return to the clinic for a follow-up visit. Patient educated on when it is appropriate to go to the emergency department.  Time call ended:  10:36  I provided 12 minutes of  non face-to-face time during this encounter.    Daryll Drown, NP

## 2022-03-25 ENCOUNTER — Other Ambulatory Visit: Payer: Self-pay | Admitting: Nurse Practitioner

## 2022-03-25 DIAGNOSIS — U071 COVID-19: Secondary | ICD-10-CM

## 2022-03-25 LAB — BMP8+EGFR
BUN/Creatinine Ratio: 13 (ref 10–24)
BUN: 19 mg/dL (ref 10–36)
CO2: 22 mmol/L (ref 20–29)
Calcium: 8.6 mg/dL (ref 8.6–10.2)
Chloride: 95 mmol/L — ABNORMAL LOW (ref 96–106)
Creatinine, Ser: 1.5 mg/dL — ABNORMAL HIGH (ref 0.76–1.27)
Glucose: 228 mg/dL — ABNORMAL HIGH (ref 70–99)
Potassium: 4.7 mmol/L (ref 3.5–5.2)
Sodium: 131 mmol/L — ABNORMAL LOW (ref 134–144)
eGFR: 42 mL/min/1.73 — ABNORMAL LOW

## 2022-03-25 MED ORDER — NIRMATRELVIR/RITONAVIR (PAXLOVID) TABLET (RENAL DOSING): 2.0000 | ORAL_TABLET | Freq: Two times a day (BID) | ORAL | 0 refills | Status: AC

## 2022-03-31 ENCOUNTER — Ambulatory Visit (INDEPENDENT_AMBULATORY_CARE_PROVIDER_SITE_OTHER): Payer: 59 | Admitting: Family Medicine

## 2022-03-31 ENCOUNTER — Encounter: Payer: Self-pay | Admitting: Family Medicine

## 2022-03-31 VITALS — BP 106/64 | HR 69 | Temp 97.1°F | Resp 20 | Ht 71.0 in | Wt 182.0 lb

## 2022-03-31 DIAGNOSIS — E1169 Type 2 diabetes mellitus with other specified complication: Secondary | ICD-10-CM | POA: Diagnosis not present

## 2022-03-31 DIAGNOSIS — E1122 Type 2 diabetes mellitus with diabetic chronic kidney disease: Secondary | ICD-10-CM

## 2022-03-31 DIAGNOSIS — E785 Hyperlipidemia, unspecified: Secondary | ICD-10-CM

## 2022-03-31 DIAGNOSIS — E1159 Type 2 diabetes mellitus with other circulatory complications: Secondary | ICD-10-CM

## 2022-03-31 DIAGNOSIS — I152 Hypertension secondary to endocrine disorders: Secondary | ICD-10-CM

## 2022-03-31 DIAGNOSIS — N1831 Chronic kidney disease, stage 3a: Secondary | ICD-10-CM | POA: Diagnosis not present

## 2022-03-31 LAB — BAYER DCA HB A1C WAIVED: HB A1C (BAYER DCA - WAIVED): 7.8 % — ABNORMAL HIGH (ref 4.8–5.6)

## 2022-04-01 LAB — CBC WITH DIFFERENTIAL/PLATELET
Basophils Absolute: 0 10*3/uL (ref 0.0–0.2)
Basos: 0 %
EOS (ABSOLUTE): 0.2 10*3/uL (ref 0.0–0.4)
Eos: 3 %
Hematocrit: 41.1 % (ref 37.5–51.0)
Hemoglobin: 13.1 g/dL (ref 13.0–17.7)
Immature Grans (Abs): 0 10*3/uL (ref 0.0–0.1)
Immature Granulocytes: 0 %
Lymphocytes Absolute: 1.4 10*3/uL (ref 0.7–3.1)
Lymphs: 31 %
MCH: 27.8 pg (ref 26.6–33.0)
MCHC: 31.9 g/dL (ref 31.5–35.7)
MCV: 87 fL (ref 79–97)
Monocytes Absolute: 0.8 10*3/uL (ref 0.1–0.9)
Monocytes: 18 %
Neutrophils Absolute: 2.2 10*3/uL (ref 1.4–7.0)
Neutrophils: 48 %
Platelets: 158 10*3/uL (ref 150–450)
RBC: 4.72 x10E6/uL (ref 4.14–5.80)
RDW: 12.7 % (ref 11.6–15.4)
WBC: 4.5 10*3/uL (ref 3.4–10.8)

## 2022-04-01 LAB — CMP14+EGFR
ALT: 27 IU/L (ref 0–44)
AST: 25 IU/L (ref 0–40)
Albumin/Globulin Ratio: 1.1 — ABNORMAL LOW (ref 1.2–2.2)
Albumin: 3.9 g/dL (ref 3.5–4.6)
Alkaline Phosphatase: 53 IU/L (ref 44–121)
BUN/Creatinine Ratio: 12 (ref 10–24)
BUN: 17 mg/dL (ref 10–36)
Bilirubin Total: 0.6 mg/dL (ref 0.0–1.2)
CO2: 21 mmol/L (ref 20–29)
Calcium: 9.4 mg/dL (ref 8.6–10.2)
Chloride: 100 mmol/L (ref 96–106)
Creatinine, Ser: 1.39 mg/dL — ABNORMAL HIGH (ref 0.76–1.27)
Globulin, Total: 3.4 g/dL (ref 1.5–4.5)
Glucose: 190 mg/dL — ABNORMAL HIGH (ref 70–99)
Potassium: 5.4 mmol/L — ABNORMAL HIGH (ref 3.5–5.2)
Sodium: 136 mmol/L (ref 134–144)
Total Protein: 7.3 g/dL (ref 6.0–8.5)
eGFR: 46 mL/min/{1.73_m2} — ABNORMAL LOW (ref >59)

## 2022-04-03 ENCOUNTER — Encounter: Payer: Self-pay | Admitting: Emergency Medicine

## 2022-04-07 ENCOUNTER — Ambulatory Visit: Payer: 59 | Admitting: Student

## 2022-05-29 ENCOUNTER — Other Ambulatory Visit: Payer: Self-pay | Admitting: Family Medicine

## 2022-05-29 DIAGNOSIS — E1169 Type 2 diabetes mellitus with other specified complication: Secondary | ICD-10-CM

## 2022-05-29 DIAGNOSIS — E1159 Type 2 diabetes mellitus with other circulatory complications: Secondary | ICD-10-CM

## 2022-07-28 ENCOUNTER — Encounter: Payer: Self-pay | Admitting: Family Medicine

## 2022-07-28 ENCOUNTER — Ambulatory Visit (INDEPENDENT_AMBULATORY_CARE_PROVIDER_SITE_OTHER): Payer: Self-pay | Admitting: Family Medicine

## 2022-07-28 VITALS — BP 167/69 | HR 52 | Temp 98.2°F | Ht 71.0 in | Wt 182.8 lb

## 2022-07-28 DIAGNOSIS — G629 Polyneuropathy, unspecified: Secondary | ICD-10-CM

## 2022-07-28 DIAGNOSIS — E1169 Type 2 diabetes mellitus with other specified complication: Secondary | ICD-10-CM

## 2022-07-28 DIAGNOSIS — N1831 Chronic kidney disease, stage 3a: Secondary | ICD-10-CM

## 2022-07-28 DIAGNOSIS — E785 Hyperlipidemia, unspecified: Secondary | ICD-10-CM

## 2022-07-28 DIAGNOSIS — E1122 Type 2 diabetes mellitus with diabetic chronic kidney disease: Secondary | ICD-10-CM

## 2022-07-28 DIAGNOSIS — I152 Hypertension secondary to endocrine disorders: Secondary | ICD-10-CM

## 2022-07-28 DIAGNOSIS — E1159 Type 2 diabetes mellitus with other circulatory complications: Secondary | ICD-10-CM

## 2022-07-28 LAB — BAYER DCA HB A1C WAIVED: HB A1C (BAYER DCA - WAIVED): 7.9 % — ABNORMAL HIGH (ref 4.8–5.6)

## 2022-07-28 MED ORDER — AMLODIPINE BESYLATE 2.5 MG PO TABS: 2.5000 mg | ORAL_TABLET | Freq: Every day | ORAL | 3 refills | Status: DC

## 2022-07-28 MED ORDER — ROSUVASTATIN CALCIUM 10 MG PO TABS: 10.0000 mg | ORAL_TABLET | Freq: Every day | ORAL | 3 refills | Status: DC

## 2022-07-28 NOTE — Progress Notes (Signed)
Subjective: CC:DM PCP: Janora Norlander, DO VVZ:SMOLMBEM Roy Ibarra is a 88 y.o. male presenting to clinic today for:  1. Type 2 Diabetes with hypertension, hyperlipidemia and CKD3:  Diet managed diabetes.  We have kept his A1c below 7.9 due to advanced age.  He has a birthday tomorrow and will be 88 years old.  Much of his meal prep is done by his family, whom he resides with.  Activity is limited due to arthralgias and generalized physical deconditioning.  He reports feeling well.  Not monitoring blood pressures at home but is compliant with Norvasc and statin.  Needs refills  Last eye exam: UTD Last foot exam: needs Last A1c:  Lab Results  Component Value Date   HGBA1C 7.8 (H) 03/31/2022   Nephropathy screen indicated?: UTD Last flu, zoster and/or pneumovax:  Immunization History  Administered Date(s) Administered   DTaP 07/29/2016   Fluad Quad(high Dose 65+) 07/20/2020   Influenza,inj,Quad PF,6+ Mos 08/22/2018, 07/10/2019   Influenza,trivalent, recombinat, inj, PF 08/06/2015, 08/07/2017   Influenza-Unspecified 08/22/2016   Moderna Sars-Covid-2 Vaccination 11/12/2019, 12/06/2019    ROS: No chest pain, shortness of breath, blurred vision reported.  He does occasionally get dizziness when he changes positions.    ROS: Per HPI  Allergies  Allergen Reactions   Ace Inhibitors Swelling   Past Medical History:  Diagnosis Date   CAD (coronary artery disease)    CKD (chronic kidney disease), stage III (HCC)    Hyperlipidemia    Hypertension    Ischemic cardiomyopathy     Current Outpatient Medications:    acetaminophen (TYLENOL) 500 MG tablet, Take 1 tablet (500 mg total) by mouth every 6 (six) hours as needed., Disp: 30 tablet, Rfl: 0   albuterol (VENTOLIN HFA) 108 (90 Base) MCG/ACT inhaler, Inhale 2 puffs into the lungs every 2 (two) hours as needed for wheezing or shortness of breath., Disp: 18 g, Rfl: 0   amLODipine (NORVASC) 2.5 MG tablet, Take 1 tablet by mouth  once daily, Disp: 90 tablet, Rfl: 0   aspirin 81 MG chewable tablet, Chew 81 mg by mouth daily., Disp: , Rfl:    blood glucose meter kit and supplies, Dispense based on patient and insurance preference. Use up to four times daily as directed. (FOR ICD-10 E10.9, E11.9)., Disp: 1 each, Rfl: 0   dorzolamide-timolol (COSOPT) 22.3-6.8 MG/ML ophthalmic solution, Place 1 drop into both eyes 2 (two) times daily., Disp: , Rfl:    glucose blood test strip, Check BGs daily. E11.9, Disp: 100 each, Rfl: 12   guaiFENesin (MUCINEX) 600 MG 12 hr tablet, Take 1 tablet (600 mg total) by mouth 2 (two) times daily., Disp: 30 tablet, Rfl: 0   Lancet Device MISC, UAD to check BGs daily E11.9, Disp: 100 each, Rfl: 12   latanoprost (XALATAN) 0.005 % ophthalmic solution, Place 1 drop into both eyes at bedtime., Disp: , Rfl:    Multiple Vitamin (MULTIVITAMIN) tablet, Take 1 tablet by mouth daily., Disp: , Rfl:    rosuvastatin (CRESTOR) 10 MG tablet, Take 1 tablet by mouth once daily, Disp: 90 tablet, Rfl: 0 Social History   Socioeconomic History   Marital status: Widowed    Spouse name: Not on file   Number of children: Not on file   Years of education: Not on file   Highest education level: Not on file  Occupational History   Not on file  Tobacco Use   Smoking status: Never   Smokeless tobacco: Never  Vaping Use  Vaping Use: Never used  Substance and Sexual Activity   Alcohol use: Not Currently   Drug use: Never   Sexual activity: Not Currently  Other Topics Concern   Not on file  Social History Narrative   Not on file   Social Determinants of Health   Financial Resource Strain: Not on file  Food Insecurity: Not on file  Transportation Needs: Not on file  Physical Activity: Not on file  Stress: Not on file  Social Connections: Not on file  Intimate Partner Violence: Not on file   Family History  Problem Relation Age of Onset   Diabetes Brother    Diabetes Brother    Diabetes Brother      Objective: Office vital signs reviewed. BP (!) 167/69   Pulse (!) 52   Temp 98.2 F (36.8 C)   Ht '5\' 11"'  (1.803 m)   Wt 182 lb 12.8 oz (82.9 kg)   SpO2 100%   BMI 25.50 kg/m   Physical Examination:  General: Awake, alert, elderly male. No acute distress HEENT: sclera white, MMM Cardio: Bradycardic with regular rhythm, S1S2 heard, no murmurs appreciated Pulm: clear to auscultation bilaterally, no wheezes, rhonchi or rales; normal work of breathing on room air MSK: Arrives in wheelchair Neuro: see DM foot Diabetic Foot Exam - Simple   Simple Foot Form Diabetic Foot exam was performed with the following findings: Yes 07/28/2022  2:46 PM  Visual Inspection See comments: Yes Sensation Testing See comments: Yes Pulse Check Posterior Tibialis and Dorsalis pulse intact bilaterally: Yes Comments Absent monofilament sensation in digits 2 through 4 and the fourth metatarsal on the right.  He has absent monofilament sensation in digits 3 and 4 on the left.  Has some mild maceration that is interdigital but no appreciable ulcerations or callus formation on either foot      Assessment/ Plan: 88 y.o. male   Type 2 diabetes mellitus with stage 3a chronic kidney disease, without long-term current use of insulin ([place]) - Plan: Bayer DCA Hb A1c Waived, Renal function panel  Polyneuropathy  Hypertension associated with diabetes ([place]) - Plan: amLODipine (NORVASC) 2.5 MG tablet  Hyperlipidemia associated with type 2 diabetes mellitus ([place]) - Plan: rosuvastatin (CRESTOR) 10 MG tablet  Sugar is technically at goal for advanced age but we discussed that 7.9 A1c is the upper limit of what I would consider acceptable in this patient.  If he goes much above this, we may need to consider low-dose metformin.  Would certainly not recommend higher doses given CKD 3A.  Check renal function today.  Diabetic foot exam performed.  Foot exam was notable for loss of sensation in digits 2 through 3 on  the right and digits 3 and 4 on the left.  No history of ulceration or evidence of preulcerative calluses on exam  His blood pressure is outside of goal but given his orthostasis I fear advancing the amlodipine more than what he is on.  Would worry about loss of consciousness due to orthostasis.  He experiences dizziness with positional changes.  Continue statin as directed.  Not due for fasting lipid  Orders Placed This Encounter  Procedures   Bayer DCA Hb A1c Waived   Renal function panel   No orders of the defined types were placed in this encounter.    Janora Norlander, DO [place] [phone]

## 2022-07-29 LAB — RENAL FUNCTION PANEL
Albumin: 3.9 g/dL (ref 3.6–4.6)
BUN/Creatinine Ratio: 15 (ref 10–24)
BUN: 21 mg/dL (ref 10–36)
CO2: 22 mmol/L (ref 20–29)
Calcium: 9.3 mg/dL (ref 8.6–10.2)
Chloride: 103 mmol/L (ref 96–106)
Creatinine, Ser: 1.38 mg/dL — ABNORMAL HIGH (ref 0.76–1.27)
Glucose: 146 mg/dL — ABNORMAL HIGH (ref 70–99)
Phosphorus: 4.2 mg/dL — ABNORMAL HIGH (ref 2.8–4.1)
Potassium: 4.5 mmol/L (ref 3.5–5.2)
Sodium: 138 mmol/L (ref 134–144)
eGFR: 47 mL/min/{1.73_m2} — ABNORMAL LOW (ref >59)

## 2022-08-11 ENCOUNTER — Ambulatory Visit: Payer: Self-pay | Admitting: Cardiology

## 2022-09-22 DIAGNOSIS — H401133 Primary open-angle glaucoma, bilateral, severe stage: Secondary | ICD-10-CM | POA: Diagnosis not present

## 2022-10-27 ENCOUNTER — Other Ambulatory Visit (INDEPENDENT_AMBULATORY_CARE_PROVIDER_SITE_OTHER): Payer: 59

## 2022-10-27 ENCOUNTER — Ambulatory Visit: Payer: 59 | Admitting: Family Medicine

## 2022-10-27 ENCOUNTER — Encounter: Payer: Self-pay | Admitting: Family Medicine

## 2022-10-27 VITALS — BP 160/79 | HR 60 | Temp 98.5°F | Ht 71.0 in | Wt 178.0 lb

## 2022-10-27 DIAGNOSIS — I152 Hypertension secondary to endocrine disorders: Secondary | ICD-10-CM | POA: Diagnosis not present

## 2022-10-27 DIAGNOSIS — R000 Tachycardia, unspecified: Secondary | ICD-10-CM

## 2022-10-27 DIAGNOSIS — E785 Hyperlipidemia, unspecified: Secondary | ICD-10-CM

## 2022-10-27 DIAGNOSIS — N1831 Chronic kidney disease, stage 3a: Secondary | ICD-10-CM

## 2022-10-27 DIAGNOSIS — E1159 Type 2 diabetes mellitus with other circulatory complications: Secondary | ICD-10-CM | POA: Diagnosis not present

## 2022-10-27 DIAGNOSIS — R5381 Other malaise: Secondary | ICD-10-CM

## 2022-10-27 DIAGNOSIS — E1122 Type 2 diabetes mellitus with diabetic chronic kidney disease: Secondary | ICD-10-CM

## 2022-10-27 DIAGNOSIS — E1169 Type 2 diabetes mellitus with other specified complication: Secondary | ICD-10-CM

## 2022-10-27 LAB — BAYER DCA HB A1C WAIVED: HB A1C (BAYER DCA - WAIVED): 10 % — ABNORMAL HIGH (ref 4.8–5.6)

## 2022-10-27 MED ORDER — AMLODIPINE BESYLATE 2.5 MG PO TABS: 2.5000 mg | ORAL_TABLET | Freq: Every day | ORAL | 3 refills | Status: DC

## 2022-10-27 MED ORDER — ROSUVASTATIN CALCIUM 10 MG PO TABS: 10.0000 mg | ORAL_TABLET | Freq: Every day | ORAL | 3 refills | Status: DC

## 2022-10-27 NOTE — Patient Instructions (Addendum)
Am BGs and Postprandial BGs Send in heart monitor and I will review and get back to you

## 2022-10-27 NOTE — Progress Notes (Signed)
Subjective: CC:DM PCP: Janora Norlander, DO FAO:ZHYQMVHQ Jocson is a 89 y.o. male who is brought to the office by his daughter.  He is presenting to clinic today for:  1. Type 2 Diabetes with hypertension, hyperlipidemia w/ CKD3a:  Much of the history is provide by his daughter.  She notes that she did measure some blood sugars in the 190s.  He admits to being tired and has reported some intermittent tachycardia.  She has not appreciated an abnormal rhythm.  Not measuring blood pressures consistently at home as these have been fluctuant in the past and not dependable.  He is compliant with Norvasc 2.5 mg daily and his rosuvastatin 10 mg daily.  Last eye exam: UTD Last foot exam: UTD Last A1c:  Lab Results  Component Value Date   HGBA1C 7.9 (H) 07/28/2022   Nephropathy screen indicated?: UTD Last flu, zoster and/or pneumovax:  Immunization History  Administered Date(s) Administered   DTaP 07/29/2016   Fluad Quad(high Dose 65+) 07/20/2020, 07/24/2022   Influenza,inj,Quad PF,6+ Mos 08/22/2018, 07/10/2019   Influenza,trivalent, recombinat, inj, PF 08/06/2015, 08/07/2017   Influenza-Unspecified 08/22/2016   Moderna Sars-Covid-2 Vaccination 11/12/2019, 12/06/2019   ROS: Per HPI  Allergies  Allergen Reactions   Ace Inhibitors Swelling   Past Medical History:  Diagnosis Date   Acute diastolic CHF (congestive heart failure) ([place]) 12/07/2021   CAD (coronary artery disease)    CKD (chronic kidney disease), stage III (HCC)    Hyperlipidemia    Hypertension    Ischemic cardiomyopathy     Current Outpatient Medications:    acetaminophen (TYLENOL) 500 MG tablet, Take 1 tablet (500 mg total) by mouth every 6 (six) hours as needed., Disp: 30 tablet, Rfl: 0   amLODipine (NORVASC) 2.5 MG tablet, Take 1 tablet (2.5 mg total) by mouth daily., Disp: 90 tablet, Rfl: 3   aspirin 81 MG chewable tablet, Chew 81 mg by mouth daily., Disp: , Rfl:    blood glucose meter kit and supplies,  Dispense based on patient and insurance preference. Use up to four times daily as directed. (FOR ICD-10 E10.9, E11.9)., Disp: 1 each, Rfl: 0   Casanthranol-Docusate Sodium (STOOL SOFTENER PLUS PO), Take by mouth., Disp: , Rfl:    dorzolamide-timolol (COSOPT) 22.3-6.8 MG/ML ophthalmic solution, Place 1 drop into both eyes 2 (two) times daily., Disp: , Rfl:    glucose blood test strip, Check BGs daily. E11.9, Disp: 100 each, Rfl: 12   Lancet Device MISC, UAD to check BGs daily E11.9, Disp: 100 each, Rfl: 12   latanoprost (XALATAN) 0.005 % ophthalmic solution, Place 1 drop into both eyes at bedtime., Disp: , Rfl:    rosuvastatin (CRESTOR) 10 MG tablet, Take 1 tablet (10 mg total) by mouth daily., Disp: 90 tablet, Rfl: 3 Social History   Socioeconomic History   Marital status: Widowed    Spouse name: Not on file   Number of children: Not on file   Years of education: Not on file   Highest education level: Not on file  Occupational History   Not on file  Tobacco Use   Smoking status: Never   Smokeless tobacco: Never  Vaping Use   Vaping Use: Never used  Substance and Sexual Activity   Alcohol use: Not Currently   Drug use: Never   Sexual activity: Not Currently  Other Topics Concern   Not on file  Social History Narrative   Not on file   Social Determinants of Health   Financial Resource Strain: Not  on file  Food Insecurity: Not on file  Transportation Needs: Not on file  Physical Activity: Not on file  Stress: Not on file  Social Connections: Not on file  Intimate Partner Violence: Not on file   Family History  Problem Relation Age of Onset   Diabetes Brother    Diabetes Brother    Diabetes Brother     Objective: Office vital signs reviewed. BP (!) 160/79   Pulse 60   Temp 98.5 F (36.9 C)   Ht 5\' 11"  (1.803 m)   Wt 178 lb (80.7 kg)   SpO2 100%   BMI 24.83 kg/m   Physical Examination:  General: Awake, alert, No acute distress HEENT: sclera white, MMM Cardio:  regular rate and rhythm, S1S2 heard, no murmurs appreciated Pulm: clear to auscultation bilaterally, no wheezes, rhonchi or rales; normal work of breathing on room air MSK: Arrives in wheelchair Ext: No edema appreciated.  Assessment/ Plan: 89 y.o. male   Type 2 diabetes mellitus with stage 3a chronic kidney disease, without long-term current use of insulin ([place]) - Plan: Bayer DCA Hb A1c Waived, Renal Function Panel, CBC, VITAMIN D 25 Hydroxy (Vit-D Deficiency, Fractures)  Hyperlipidemia associated with type 2 diabetes mellitus ([place]) - Plan: rosuvastatin (CRESTOR) 10 MG tablet  Hypertension associated with diabetes ([place]) - Plan: Renal Function Panel, amLODipine (NORVASC) 2.5 MG tablet  Tachycardia - Plan: LONG TERM MONITOR (3-14 DAYS)  Physical deconditioning  Blood pressure is not at goal today and nor is his sugar.  I am inclined to advance his Norvasc to 5 mg daily but I am somewhat hesitant given frailty.  Will check renal function panel.  Check vitamin D, CBC given renal disease.  A1c up to 10 today and I think this is largely due to diet and lack of exercise.  Given advanced renal disease we may consider starting on glipizide.  Continue statin  Zio patch applied today given reports of intermittent tachycardia.  Further plan pending these results  Discussed consideration for physical therapy again but they want to try and work with the patient at home first.  He will be having a caregiver that will be live in soon hired later this week.  His daughter will contact me should they decide to proceed with in-home physical therapy again  No orders of the defined types were placed in this encounter.  No orders of the defined types were placed in this encounter.  Janora Norlander, DO [place] [phone]

## 2022-10-28 ENCOUNTER — Encounter: Payer: Self-pay | Admitting: Family Medicine

## 2022-10-28 LAB — RENAL FUNCTION PANEL
Albumin: 3.8 g/dL (ref 3.6–4.6)
BUN/Creatinine Ratio: 13 (ref 10–24)
BUN: 24 mg/dL (ref 10–36)
CO2: 21 mmol/L (ref 20–29)
Calcium: 9.3 mg/dL (ref 8.6–10.2)
Chloride: 100 mmol/L (ref 96–106)
Creatinine, Ser: 1.82 mg/dL — ABNORMAL HIGH (ref 0.76–1.27)
Glucose: 168 mg/dL — ABNORMAL HIGH (ref 70–99)
Phosphorus: 4.5 mg/dL — ABNORMAL HIGH (ref 2.8–4.1)
Potassium: 4.8 mmol/L (ref 3.5–5.2)
Sodium: 137 mmol/L (ref 134–144)
eGFR: 33 mL/min/{1.73_m2} — ABNORMAL LOW (ref >59)

## 2022-10-28 LAB — CBC
Hematocrit: 39.5 % (ref 37.5–51.0)
Hemoglobin: 12.3 g/dL — ABNORMAL LOW (ref 13.0–17.7)
MCH: 28.3 pg (ref 26.6–33.0)
MCHC: 31.1 g/dL — ABNORMAL LOW (ref 31.5–35.7)
MCV: 91 fL (ref 79–97)
Platelets: 146 10*3/uL — ABNORMAL LOW (ref 150–450)
RBC: 4.35 x10E6/uL (ref 4.14–5.80)
RDW: 12.6 % (ref 11.6–15.4)
WBC: 5.1 10*3/uL (ref 3.4–10.8)

## 2022-10-28 LAB — VITAMIN D 25 HYDROXY (VIT D DEFICIENCY, FRACTURES): Vit D, 25-Hydroxy: 28.7 ng/mL — ABNORMAL LOW (ref 30.0–100.0)

## 2022-10-29 ENCOUNTER — Other Ambulatory Visit: Payer: Self-pay | Admitting: Family Medicine

## 2022-10-29 DIAGNOSIS — E1122 Type 2 diabetes mellitus with diabetic chronic kidney disease: Secondary | ICD-10-CM

## 2022-10-29 MED ORDER — DAPAGLIFLOZIN PROPANEDIOL 5 MG PO TABS: 5.0000 mg | ORAL_TABLET | Freq: Every day | ORAL | 0 refills | Status: DC

## 2022-11-13 DIAGNOSIS — R000 Tachycardia, unspecified: Secondary | ICD-10-CM | POA: Diagnosis not present

## 2022-11-20 ENCOUNTER — Ambulatory Visit (HOSPITAL_COMMUNITY)
Admission: RE | Admit: 2022-11-20 | Discharge: 2022-11-20 | Disposition: A | Discharge status: Home / Self Care | Payer: 59 | Source: Ambulatory Visit | Attending: Family Medicine | Admitting: Family Medicine

## 2022-11-20 ENCOUNTER — Encounter: Payer: Self-pay | Admitting: Family Medicine

## 2022-11-20 ENCOUNTER — Other Ambulatory Visit (HOSPITAL_COMMUNITY)
Admission: RE | Admit: 2022-11-20 | Discharge: 2022-11-20 | Disposition: A | Discharge status: Home / Self Care | Payer: 59 | Source: Ambulatory Visit | Attending: Family Medicine | Admitting: Family Medicine

## 2022-11-20 ENCOUNTER — Ambulatory Visit: Payer: 59 | Admitting: Family Medicine

## 2022-11-20 ENCOUNTER — Telehealth: Payer: Self-pay | Admitting: *Deleted

## 2022-11-20 VITALS — BP 179/75 | HR 62 | Ht 71.0 in | Wt 181.0 lb

## 2022-11-20 DIAGNOSIS — R404 Transient alteration of awareness: Secondary | ICD-10-CM | POA: Insufficient documentation

## 2022-11-20 DIAGNOSIS — R4182 Altered mental status, unspecified: Secondary | ICD-10-CM | POA: Diagnosis not present

## 2022-11-20 LAB — CBC WITH DIFFERENTIAL/PLATELET
Abs Immature Granulocytes: 0.01 10*3/uL (ref 0.00–0.07)
Basophils Absolute: 0 10*3/uL (ref 0.0–0.1)
Basophils Relative: 0 %
Eosinophils Absolute: 0.3 10*3/uL (ref 0.0–0.5)
Eosinophils Relative: 4 %
HCT: 38.6 % — ABNORMAL LOW (ref 39.0–52.0)
Hemoglobin: 12.7 g/dL — ABNORMAL LOW (ref 13.0–17.0)
Immature Granulocytes: 0 %
Lymphocytes Relative: 26 %
Lymphs Abs: 1.5 10*3/uL (ref 0.7–4.0)
MCH: 28.3 pg (ref 26.0–34.0)
MCHC: 32.9 g/dL (ref 30.0–36.0)
MCV: 86 fL (ref 80.0–100.0)
Monocytes Absolute: 1 10*3/uL (ref 0.1–1.0)
Monocytes Relative: 17 %
Neutro Abs: 3.2 10*3/uL (ref 1.7–7.7)
Neutrophils Relative %: 53 %
Platelets: 142 10*3/uL — ABNORMAL LOW (ref 150–400)
RBC: 4.49 MIL/uL (ref 4.22–5.81)
RDW: 14.5 % (ref 11.5–15.5)
WBC: 6 10*3/uL (ref 4.0–10.5)
nRBC: 0 % (ref 0.0–0.2)

## 2022-11-20 LAB — URINALYSIS, ROUTINE W REFLEX MICROSCOPIC
Bilirubin Urine: NEGATIVE
Glucose, UA: NEGATIVE mg/dL
Hgb urine dipstick: NEGATIVE
Ketones, ur: NEGATIVE mg/dL
Leukocytes,Ua: NEGATIVE
Nitrite: NEGATIVE
Protein, ur: NEGATIVE mg/dL
Specific Gravity, Urine: 1.009 (ref 1.005–1.030)
pH: 6 (ref 5.0–8.0)

## 2022-11-20 LAB — COMPREHENSIVE METABOLIC PANEL
ALT: 33 U/L (ref 0–44)
AST: 39 U/L (ref 15–41)
Albumin: 3.6 g/dL (ref 3.5–5.0)
Alkaline Phosphatase: 61 U/L (ref 38–126)
Anion gap: 9 (ref 5–15)
BUN: 24 mg/dL — ABNORMAL HIGH (ref 8–23)
CO2: 22 mmol/L (ref 22–32)
Calcium: 8.8 mg/dL — ABNORMAL LOW (ref 8.9–10.3)
Chloride: 97 mmol/L — ABNORMAL LOW (ref 98–111)
Creatinine, Ser: 1.38 mg/dL — ABNORMAL HIGH (ref 0.61–1.24)
GFR, Estimated: 47 mL/min — ABNORMAL LOW (ref >60)
Glucose, Bld: 169 mg/dL — ABNORMAL HIGH (ref 70–99)
Potassium: 4 mmol/L (ref 3.5–5.1)
Sodium: 128 mmol/L — ABNORMAL LOW (ref 135–145)
Total Bilirubin: 0.6 mg/dL (ref 0.3–1.2)
Total Protein: 7.7 g/dL (ref 6.5–8.1)

## 2022-11-20 NOTE — Telephone Encounter (Signed)
Fax from [place]: Wilder Glade 5 mg 1 QD before bkfst Note: not covered by ins. Jardiance preferred Please advise

## 2022-11-20 NOTE — Progress Notes (Signed)
BP (!) 179/75   Pulse 62   Ht 5' 11"$  (1.803 m)   Wt 181 lb (82.1 kg)   SpO2 97%   BMI 25.24 kg/m    Subjective:   Patient ID: Roy Ibarra, male    DOB: [birth year], 89 y.o.   MRN: [id number]  HPI: Roy Ibarra is a 89 y.o. male presenting on 11/20/2022 for weak and off balance   HPI Weak and off-balance and altered mental status Patient is being brought in today by his daughter and caretaker saying that he has been having increased weakness and being more off balance and be more wobbly and more confused than normal.  He does have dementia and they know where his baseline is but it has increased over the past week and especially over the past couple days where he is so weak he is just giving out and they have had to catch her from falling a few times.  She says the confusion has been increased from his baseline as well and they are concerned that there is something more going on than just the dementia.  Has been keeping a closer eye on his diabetes and that is been more under control.  She has been trying to hydrate him more because he was dehydrated on his last visit.  His blood pressure is elevated today but they have watched that and sometimes it is better and sometimes it is worse and this has been his standard or baseline.  Relevant past medical, surgical, family and social history reviewed and updated as indicated. Interim medical history since our last visit reviewed. Allergies and medications reviewed and updated.  Review of Systems  Constitutional:  Positive for fatigue. Negative for chills and fever.  Respiratory:  Negative for shortness of breath and wheezing.   Cardiovascular:  Negative for chest pain and leg swelling.  Skin:  Negative for rash.  Neurological:  Positive for dizziness, weakness and light-headedness. Negative for facial asymmetry, speech difficulty and numbness.  All other systems reviewed and are negative.   Per HPI unless specifically indicated  above   Allergies as of 11/20/2022       Reactions   Ace Inhibitors Swelling        Medication List        Accurate as of November 20, 2022  5:01 PM. If you have any questions, ask your nurse or doctor.          STOP taking these medications    dapagliflozin propanediol 5 MG Tabs tablet Commonly known as: [place] Stopped by: Fransisca Kaufmann Maame Dack, MD       TAKE these medications    acetaminophen 500 MG tablet Commonly known as: TYLENOL Take 1 tablet (500 mg total) by mouth every 6 (six) hours as needed.   amLODipine 2.5 MG tablet Commonly known as: NORVASC Take 1 tablet (2.5 mg total) by mouth daily.   aspirin 81 MG chewable tablet Chew 81 mg by mouth daily.   blood glucose meter kit and supplies Dispense based on patient and insurance preference. Use up to four times daily as directed. (FOR ICD-10 E10.9, E11.9).   dorzolamide-timolol 2-0.5 % ophthalmic solution Commonly known as: COSOPT Place 1 drop into both eyes 2 (two) times daily.   glucose blood test strip Check BGs daily. E11.9   Lancet Device Misc UAD to check BGs daily E11.9   latanoprost 0.005 % ophthalmic solution Commonly known as: XALATAN Place 1 drop into both eyes at bedtime.   rosuvastatin  10 MG tablet Commonly known as: CRESTOR Take 1 tablet (10 mg total) by mouth daily.   STOOL SOFTENER PLUS PO Take by mouth.         Objective:   BP (!) 179/75   Pulse 62   Ht 5' 11"$  (1.803 m)   Wt 181 lb (82.1 kg)   SpO2 97%   BMI 25.24 kg/m   Wt Readings from Last 3 Encounters:  11/20/22 181 lb (82.1 kg)  10/27/22 178 lb (80.7 kg)  07/28/22 182 lb 12.8 oz (82.9 kg)    Physical Exam Vitals and nursing note reviewed.  Constitutional:      General: He is not in acute distress.    Appearance: He is well-developed. He is not diaphoretic.  Eyes:     General: No scleral icterus.    Conjunctiva/sclera: Conjunctivae normal.  Neck:     Thyroid: No thyromegaly.  Cardiovascular:      Rate and Rhythm: Normal rate and regular rhythm.     Heart sounds: Normal heart sounds. No murmur heard. Pulmonary:     Effort: Pulmonary effort is normal. No respiratory distress.     Breath sounds: Normal breath sounds. No wheezing.  Musculoskeletal:        General: No swelling. Normal range of motion.     Cervical back: Neck supple.  Lymphadenopathy:     Cervical: No cervical adenopathy.  Skin:    General: Skin is warm and dry.     Findings: No rash.  Neurological:     Mental Status: He is alert. He is disoriented.     Cranial Nerves: No cranial nerve deficit.     Sensory: No sensory deficit.     Coordination: Coordination normal.  Psychiatric:        Behavior: Behavior normal.       Assessment & Plan:   Problem List Items Addressed This Visit   None Visit Diagnoses     Transient alteration of awareness    -  Primary   Relevant Orders   CBC with Differential/Platelet   CMP14+EGFR   Urinalysis, Complete   Urine Culture   DG Chest 2 View   CT HEAD WO CONTRAST (5MM)       Will check for urinalysis and blood work and look for any signs of infection, will try and get a chest x-ray over at [facility] tomorrow and a CT head if we can as well checking for possible cause of altered mental status Follow up plan: Return if symptoms worsen or fail to improve.  Counseling provided for all of the vaccine components Orders Placed This Encounter  Procedures   Urine Culture   DG Chest 2 View   CT HEAD WO CONTRAST (5MM)   CBC with Differential/Platelet   CMP14+EGFR   Urinalysis, Complete    Caryl Pina, MD [facility] 11/20/2022, 5:01 PM

## 2022-11-21 ENCOUNTER — Other Ambulatory Visit: Payer: Self-pay | Admitting: Family Medicine

## 2022-11-21 DIAGNOSIS — E1122 Type 2 diabetes mellitus with diabetic chronic kidney disease: Secondary | ICD-10-CM

## 2022-11-21 MED ORDER — EMPAGLIFLOZIN 10 MG PO TABS: 10.0000 mg | ORAL_TABLET | Freq: Every day | ORAL | 3 refills | Status: DC

## 2022-11-21 NOTE — Telephone Encounter (Signed)
Sent. Please inform his daughter, Dr Moshe Cipro, that we switched due to ins.

## 2022-11-22 LAB — URINE CULTURE: Culture: 30000 — AB

## 2022-11-23 ENCOUNTER — Other Ambulatory Visit: Payer: Self-pay | Admitting: Family Medicine

## 2022-11-23 DIAGNOSIS — R404 Transient alteration of awareness: Secondary | ICD-10-CM

## 2022-11-23 MED ORDER — SULFAMETHOXAZOLE-TRIMETHOPRIM 800-160 MG PO TABS: 1.0000 | ORAL_TABLET | Freq: Two times a day (BID) | ORAL | 0 refills | Status: DC

## 2022-11-24 NOTE — Telephone Encounter (Signed)
Lmtcb.

## 2022-11-24 NOTE — Telephone Encounter (Signed)
Patient aware and verbalized understanding. °

## 2022-11-25 ENCOUNTER — Ambulatory Visit (HOSPITAL_COMMUNITY): Payer: 59

## 2022-12-01 ENCOUNTER — Other Ambulatory Visit: Payer: 59

## 2022-12-04 ENCOUNTER — Encounter: Payer: Self-pay | Admitting: Radiology

## 2022-12-15 ENCOUNTER — Ambulatory Visit
Admission: RE | Admit: 2022-12-15 | Discharge: 2022-12-15 | Disposition: A | Discharge status: Home / Self Care | Payer: 59 | Source: Ambulatory Visit | Attending: Family Medicine | Admitting: Family Medicine

## 2022-12-15 DIAGNOSIS — R410 Disorientation, unspecified: Secondary | ICD-10-CM | POA: Diagnosis not present

## 2022-12-15 DIAGNOSIS — R404 Transient alteration of awareness: Secondary | ICD-10-CM

## 2023-02-02 ENCOUNTER — Ambulatory Visit: Payer: 59 | Admitting: Family Medicine

## 2023-02-02 ENCOUNTER — Encounter: Payer: Self-pay | Admitting: Family Medicine

## 2023-02-02 VITALS — BP 165/76 | HR 58 | Temp 98.3°F | Ht 71.0 in | Wt 178.0 lb

## 2023-02-02 DIAGNOSIS — E785 Hyperlipidemia, unspecified: Secondary | ICD-10-CM

## 2023-02-02 DIAGNOSIS — E1122 Type 2 diabetes mellitus with diabetic chronic kidney disease: Secondary | ICD-10-CM | POA: Diagnosis not present

## 2023-02-02 DIAGNOSIS — H6123 Impacted cerumen, bilateral: Secondary | ICD-10-CM | POA: Diagnosis not present

## 2023-02-02 DIAGNOSIS — I152 Hypertension secondary to endocrine disorders: Secondary | ICD-10-CM | POA: Diagnosis not present

## 2023-02-02 DIAGNOSIS — E1169 Type 2 diabetes mellitus with other specified complication: Secondary | ICD-10-CM | POA: Diagnosis not present

## 2023-02-02 DIAGNOSIS — N1831 Chronic kidney disease, stage 3a: Secondary | ICD-10-CM | POA: Diagnosis not present

## 2023-02-02 DIAGNOSIS — R5381 Other malaise: Secondary | ICD-10-CM | POA: Diagnosis not present

## 2023-02-02 DIAGNOSIS — E1159 Type 2 diabetes mellitus with other circulatory complications: Secondary | ICD-10-CM

## 2023-02-02 LAB — BAYER DCA HB A1C WAIVED: HB A1C (BAYER DCA - WAIVED): 7.4 % — ABNORMAL HIGH (ref 4.8–5.6)

## 2023-02-02 NOTE — Progress Notes (Unsigned)
Subjective: CC:Dm PCP: Raliegh Ip, DO YQM:VHQIONGE Hammers is a 89 y.o. male presenting to clinic today for:  1. Type 2 Diabetes with hypertension, hyperlipidemia w/ CKD3:  Jardiance ordered but never started.  He is accompanied to the visit today by his daughter, Roy Ibarra, and his new caregiver Roy Ibarra.  Had a couple of falls in February but no falls since.  Overall he seems to be doing well.  His daughter notices that he seems to become more forgetful as he ages.  The other day he actually had some difficulty word finding.  They have been able to get his sugar down with diet alone.  No hypoglycemic episodes reported.  Checks blood sugar regularly.  Last eye exam: UTD, on eye drops Last foot exam: UTD Last A1c:  Lab Results  Component Value Date   HGBA1C 10.0 (H) 10/27/2022   Nephropathy screen indicated?: UTD Last flu, zoster and/or pneumovax:  Immunization History  Administered Date(s) Administered   DTaP 07/29/2016   Fluad Quad(high Dose 65+) 07/20/2020, 07/24/2022   Influenza,inj,Quad PF,6+ Mos 08/22/2018, 07/10/2019   Influenza,trivalent, recombinat, inj, PF 08/06/2015, 08/07/2017   Influenza-Unspecified 08/22/2016   Moderna Sars-Covid-2 Vaccination 11/12/2019, 12/06/2019   2. Hearing difficulty Would like to check ears for possible need for irrigation as he is hearing difficulty.   ROS: Per HPI  Allergies  Allergen Reactions   Ace Inhibitors Swelling   Past Medical History:  Diagnosis Date   Acute diastolic CHF (congestive heart failure) (HCC) 12/07/2021   CAD (coronary artery disease)    CKD (chronic kidney disease), stage III (HCC)    Hyperlipidemia    Hypertension    Ischemic cardiomyopathy     Current Outpatient Medications:    acetaminophen (TYLENOL) 500 MG tablet, Take 1 tablet (500 mg total) by mouth every 6 (six) hours as needed., Disp: 30 tablet, Rfl: 0   amLODipine (NORVASC) 2.5 MG tablet, Take 1 tablet (2.5 mg total) by mouth daily., Disp:  90 tablet, Rfl: 3   aspirin 81 MG chewable tablet, Chew 81 mg by mouth daily., Disp: , Rfl:    blood glucose meter kit and supplies, Dispense based on patient and insurance preference. Use up to four times daily as directed. (FOR ICD-10 E10.9, E11.9)., Disp: 1 each, Rfl: 0   Casanthranol-Docusate Sodium (STOOL SOFTENER PLUS PO), Take by mouth., Disp: , Rfl:    dorzolamide-timolol (COSOPT) 22.3-6.8 MG/ML ophthalmic solution, Place 1 drop into both eyes 2 (two) times daily., Disp: , Rfl:    empagliflozin (JARDIANCE) 10 MG TABS tablet, Take 1 tablet (10 mg total) by mouth daily before breakfast., Disp: 90 tablet, Rfl: 3   glucose blood test strip, Check BGs daily. E11.9, Disp: 100 each, Rfl: 12   Lancet Device MISC, UAD to check BGs daily E11.9, Disp: 100 each, Rfl: 12   latanoprost (XALATAN) 0.005 % ophthalmic solution, Place 1 drop into both eyes at bedtime., Disp: , Rfl:    rosuvastatin (CRESTOR) 10 MG tablet, Take 1 tablet (10 mg total) by mouth daily., Disp: 90 tablet, Rfl: 3   sulfamethoxazole-trimethoprim (BACTRIM DS) 800-160 MG tablet, Take 1 tablet by mouth 2 (two) times daily., Disp: 20 tablet, Rfl: 0 Social History   Socioeconomic History   Marital status: Widowed    Spouse name: Not on file   Number of children: Not on file   Years of education: Not on file   Highest education level: Not on file  Occupational History   Not on file  Tobacco Use  Smoking status: Never   Smokeless tobacco: Never  Vaping Use   Vaping Use: Never used  Substance and Sexual Activity   Alcohol use: Not Currently   Drug use: Never   Sexual activity: Not Currently  Other Topics Concern   Not on file  Social History Narrative   Not on file   Social Determinants of Health   Financial Resource Strain: Not on file  Food Insecurity: Not on file  Transportation Needs: Not on file  Physical Activity: Not on file  Stress: Not on file  Social Connections: Not on file  Intimate Partner Violence: Not  on file   Family History  Problem Relation Age of Onset   Diabetes Brother    Diabetes Brother    Diabetes Brother     Objective: Office vital signs reviewed. BP (!) 165/76   Pulse (!) 58   Temp 98.3 F (36.8 C)   Ht 5\' 11"  (1.803 m)   Wt 178 lb (80.7 kg)   SpO2 99%   BMI 24.83 kg/m   Physical Examination:  General: Awake, alert, well nourished, No acute distress HEENT: sclera white, MMM Cardio: mildly bradycardic with regular rhythm, S1S2 heard, no murmurs appreciated Pulm: clear to auscultation bilaterally, no wheezes, rhonchi or rales; normal work of breathing on room air Extremities: warm, well perfused, No edema, cyanosis or clubbing; +2 pulses bilaterally MSK: Arrives in wheelchair.  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 male   Type 2 diabetes mellitus with stage 3a chronic kidney disease, without long-term current use of insulin (HCC) - Plan: Bayer DCA Hb A1c Waived, Basic Metabolic Panel  Hyperlipidemia associated with type 2 diabetes mellitus (HCC)  Hypertension associated with diabetes (HCC)  Physical deconditioning  Bilateral impacted cerumen  Sugar at goal for age at 7.3 today.  No changes.  Continue carb restriction.  Check renal function given CKD.  For now holding off on Jardiance start.  Continue statin.  Not due for refills  Blood pressure***  Continues to have physical deconditioning with aging.  Handicap placard forms completed today and returned to his daughter  Cerumen irrigated today  May follow-up in 4 months, sooner if concerns arise  No orders of the defined types were placed in this encounter.  No orders of the defined types were placed in this encounter.    Raliegh Ip, DO [facility] [phone]

## 2023-02-02 NOTE — Patient Instructions (Signed)
Sugar looking good!  Keep up the good work!

## 2023-02-03 ENCOUNTER — Encounter: Payer: Self-pay | Admitting: Family Medicine

## 2023-02-03 LAB — BASIC METABOLIC PANEL
BUN/Creatinine Ratio: 14 (ref 10–24)
BUN: 18 mg/dL (ref 10–36)
CO2: 19 mmol/L — ABNORMAL LOW (ref 20–29)
Calcium: 9.2 mg/dL (ref 8.6–10.2)
Chloride: 98 mmol/L (ref 96–106)
Creatinine, Ser: 1.31 mg/dL — ABNORMAL HIGH (ref 0.76–1.27)
Glucose: 115 mg/dL — ABNORMAL HIGH (ref 70–99)
Potassium: 4.6 mmol/L (ref 3.5–5.2)
Sodium: 133 mmol/L — ABNORMAL LOW (ref 134–144)
eGFR: 50 mL/min/{1.73_m2} — ABNORMAL LOW (ref >59)

## 2023-02-09 DIAGNOSIS — H401133 Primary open-angle glaucoma, bilateral, severe stage: Secondary | ICD-10-CM | POA: Diagnosis not present

## 2023-02-09 DIAGNOSIS — H04123 Dry eye syndrome of bilateral lacrimal glands: Secondary | ICD-10-CM | POA: Diagnosis not present

## 2023-02-19 ENCOUNTER — Ambulatory Visit: Payer: Self-pay | Admitting: Cardiology

## 2023-06-15 ENCOUNTER — Ambulatory Visit: Payer: 59 | Admitting: Family Medicine

## 2023-06-15 ENCOUNTER — Encounter: Payer: Self-pay | Admitting: Family Medicine

## 2023-06-15 VITALS — BP 167/74 | HR 62 | Temp 98.7°F | Ht 71.0 in | Wt 174.0 lb

## 2023-06-15 DIAGNOSIS — Z66 Do not resuscitate: Secondary | ICD-10-CM

## 2023-06-15 DIAGNOSIS — E1169 Type 2 diabetes mellitus with other specified complication: Secondary | ICD-10-CM | POA: Diagnosis not present

## 2023-06-15 DIAGNOSIS — E119 Type 2 diabetes mellitus without complications: Secondary | ICD-10-CM

## 2023-06-15 DIAGNOSIS — N183 Chronic kidney disease, stage 3 unspecified: Secondary | ICD-10-CM | POA: Diagnosis not present

## 2023-06-15 DIAGNOSIS — E1122 Type 2 diabetes mellitus with diabetic chronic kidney disease: Secondary | ICD-10-CM

## 2023-06-15 DIAGNOSIS — E785 Hyperlipidemia, unspecified: Secondary | ICD-10-CM

## 2023-06-15 DIAGNOSIS — L89621 Pressure ulcer of left heel, stage 1: Secondary | ICD-10-CM

## 2023-06-15 DIAGNOSIS — J3489 Other specified disorders of nose and nasal sinuses: Secondary | ICD-10-CM

## 2023-06-15 DIAGNOSIS — I152 Hypertension secondary to endocrine disorders: Secondary | ICD-10-CM | POA: Diagnosis not present

## 2023-06-15 DIAGNOSIS — E1159 Type 2 diabetes mellitus with other circulatory complications: Secondary | ICD-10-CM | POA: Diagnosis not present

## 2023-06-15 LAB — BAYER DCA HB A1C WAIVED: HB A1C (BAYER DCA - WAIVED): 7.2 % — ABNORMAL HIGH (ref 4.8–5.6)

## 2023-06-15 MED ORDER — DESLORATADINE 5 MG PO TABS: 5.0000 mg | ORAL_TABLET | Freq: Every day | ORAL | 3 refills | Status: DC

## 2023-06-15 MED ORDER — AZELASTINE HCL 0.1 % NA SOLN: 1.0000 | Freq: Two times a day (BID) | NASAL | 12 refills | Status: AC

## 2023-06-15 NOTE — Patient Instructions (Addendum)
Stop Zyrtec, start Clarinex.  I have also sent Astelin nasal spray to help with drainage.

## 2023-06-15 NOTE — Progress Notes (Signed)
Subjective: CC:DM PCP: Raliegh Ip, DO GLO:[id number] Tori is a 89 y.o. male presenting to clinic today for:  1. Type 2 Diabetes with hypertension, hyperlipidemia and CKD 3:  Patient is brought to the office by his daughter.  She notes that he has abided by a strict diet.  He has a birthday coming up soon.  She notes that he has been having some pain and thickening of the skin behind the left heel that she is worried about.  Wondering if perhaps he might benefit from a bunny boot to offset some of that pressure.  No overt skin breakdown yet.  Diabetes Health Maintenance Due  Topic Date Due   OPHTHALMOLOGY EXAM  06/15/2023 (Originally 02/04/2023)   FOOT EXAM  07/29/2023   HEMOGLOBIN A1C  12/13/2023    Last A1c:  Lab Results  Component Value Date   HGBA1C 7.2 (H) 06/15/2023    ROS: He report no chest pain, shortness of breath  2.  Dementia Having some worsening memory.  Wants to get a DNR today.  Also reports some increasing difficulty with secretions but not sure if this may be related to allergic rhinitis that is not controlled.  She gives him a half a tablet of Zyrtec occasionally which does seem to help with those secretions.  Not on any nasal sprays due to glaucoma but would be willing to utilize 1 that is safe   ROS: Per HPI  Allergies  Allergen Reactions   Ace Inhibitors Swelling   Past Medical History:  Diagnosis Date   Acute diastolic CHF (congestive heart failure) (HCC) 12/07/2021   CAD (coronary artery disease)    CKD (chronic kidney disease), stage III (HCC)    Hyperlipidemia    Hypertension    Ischemic cardiomyopathy     Current Outpatient Medications:    acetaminophen (TYLENOL) 500 MG tablet, Take 1 tablet (500 mg total) by mouth every 6 (six) hours as needed., Disp: 30 tablet, Rfl: 0   amLODipine (NORVASC) 2.5 MG tablet, Take 1 tablet (2.5 mg total) by mouth daily., Disp: 90 tablet, Rfl: 3   aspirin 81 MG chewable tablet, Chew 81 mg by mouth  daily., Disp: , Rfl:    azelastine (ASTELIN) 0.1 % nasal spray, Place 1 spray into both nostrils 2 (two) times daily., Disp: 30 mL, Rfl: 12   blood glucose meter kit and supplies, Dispense based on patient and insurance preference. Use up to four times daily as directed. (FOR ICD-10 E10.9, E11.9)., Disp: 1 each, Rfl: 0   Casanthranol-Docusate Sodium (STOOL SOFTENER PLUS PO), Take by mouth., Disp: , Rfl:    desloratadine (CLARINEX) 5 MG tablet, Take 1 tablet (5 mg total) by mouth daily. For allergies, Disp: 90 tablet, Rfl: 3   dorzolamide-timolol (COSOPT) 22.3-6.8 MG/ML ophthalmic solution, Place 1 drop into both eyes 2 (two) times daily., Disp: , Rfl:    glucose blood test strip, Check BGs daily. E11.9, Disp: 100 each, Rfl: 12   Lancet Device MISC, UAD to check BGs daily E11.9, Disp: 100 each, Rfl: 12   latanoprost (XALATAN) 0.005 % ophthalmic solution, Place 1 drop into both eyes at bedtime., Disp: , Rfl:    rosuvastatin (CRESTOR) 10 MG tablet, Take 1 tablet (10 mg total) by mouth daily., Disp: 90 tablet, Rfl: 3 Social History   Socioeconomic History   Marital status: Widowed    Spouse name: Not on file   Number of children: Not on file   Years of education: Not on file  Highest education level: Not on file  Occupational History   Not on file  Tobacco Use   Smoking status: Never   Smokeless tobacco: Never  Vaping Use   Vaping status: Never Used  Substance and Sexual Activity   Alcohol use: Not Currently   Drug use: Never   Sexual activity: Not Currently  Other Topics Concern   Not on file  Social History Narrative   Not on file   Social Determinants of Health   Financial Resource Strain: Not on file  Food Insecurity: Not on file  Transportation Needs: Not on file  Physical Activity: Not on file  Stress: Not on file  Social Connections: Not on file  Intimate Partner Violence: Not on file   Family History  Problem Relation Age of Onset   Diabetes Brother    Diabetes  Brother    Diabetes Brother     Objective: Office vital signs reviewed. BP (!) 167/74   Pulse 62   Temp 98.7 F (37.1 C)   Ht 5\' 11"  (1.803 m)   Wt 174 lb (78.9 kg)   SpO2 97%   BMI 24.27 kg/m   Physical Examination:  General: Awake, alert, elderly male, No acute distress HEENT: No gross rhinorrhea.  Mucous membranes are moist.  Has hearing aids in place Cardio: regular rate and rhythm with occasional skipped beat, S1S2 heard, no murmurs appreciated Pulm: clear to auscultation bilaterally, no wheezes, rhonchi or rales; normal work of breathing on room air MSK: Arrives in wheelchair.  Left posterior heel with thickened, darkened skin but no breakdown.   Assessment/ Plan: 89 y.o. male   Diet-controlled diabetes mellitus (HCC) - Plan: Basic Metabolic Panel  Hyperlipidemia associated with type 2 diabetes mellitus (HCC)  Hypertension associated with diabetes (HCC)  CKD stage 3 secondary to diabetes (HCC) - Plan: Bayer DCA Hb A1c Waived  Rhinorrhea - Plan: azelastine (ASTELIN) 0.1 % nasal spray, desloratadine (CLARINEX) 5 MG tablet  Pressure injury of left heel, stage 1  DNR (do not resuscitate)   Sugar remains at goal for age with A1c of 7.2 today.  No changes.  Check renal function.  Continue current regimen for blood pressure management and cholesterol.  Has had fluctuating blood pressures in the past.  Do not feel advancing antihypertensive is appropriate in this patient who is at risk for orthostasis and falls.  Clarinex ordered for allergy control as well as Astelin nasal spray.  No apparent contraindications to use and glaucoma patient's  Left heel appears to be a pressure injury stage I.  Bunny boot ordered and sent to [facility] and handwritten prescription also provided to his daughter  DNR form completed and given back to his daughter  Raliegh Ip, DO [facility] [phone]

## 2023-06-16 LAB — BASIC METABOLIC PANEL
BUN/Creatinine Ratio: 15 (ref 10–24)
BUN: 20 mg/dL (ref 10–36)
CO2: 23 mmol/L (ref 20–29)
Calcium: 9.2 mg/dL (ref 8.6–10.2)
Chloride: 100 mmol/L (ref 96–106)
Creatinine, Ser: 1.31 mg/dL — ABNORMAL HIGH (ref 0.76–1.27)
Glucose: 131 mg/dL — ABNORMAL HIGH (ref 70–99)
Potassium: 4.9 mmol/L (ref 3.5–5.2)
Sodium: 136 mmol/L (ref 134–144)
eGFR: 50 mL/min/{1.73_m2} — ABNORMAL LOW (ref >59)

## 2023-06-22 DIAGNOSIS — Z961 Presence of intraocular lens: Secondary | ICD-10-CM | POA: Diagnosis not present

## 2023-06-22 DIAGNOSIS — H401113 Primary open-angle glaucoma, right eye, severe stage: Secondary | ICD-10-CM | POA: Diagnosis not present

## 2023-06-22 DIAGNOSIS — E119 Type 2 diabetes mellitus without complications: Secondary | ICD-10-CM | POA: Diagnosis not present

## 2023-06-22 LAB — HM DIABETES EYE EXAM

## 2023-10-12 ENCOUNTER — Ambulatory Visit: Payer: 59 | Admitting: Family Medicine

## 2023-11-23 ENCOUNTER — Other Ambulatory Visit: Payer: Self-pay | Admitting: Family Medicine

## 2023-11-23 ENCOUNTER — Encounter: Payer: Self-pay | Admitting: Family Medicine

## 2023-11-23 ENCOUNTER — Ambulatory Visit: Payer: 59 | Admitting: Family Medicine

## 2023-11-23 ENCOUNTER — Ambulatory Visit (INDEPENDENT_AMBULATORY_CARE_PROVIDER_SITE_OTHER): Payer: 59

## 2023-11-23 VITALS — BP 145/102 | HR 120 | Temp 98.0°F | Ht 71.0 in | Wt 177.0 lb

## 2023-11-23 DIAGNOSIS — R0989 Other specified symptoms and signs involving the circulatory and respiratory systems: Secondary | ICD-10-CM

## 2023-11-23 DIAGNOSIS — N1831 Chronic kidney disease, stage 3a: Secondary | ICD-10-CM

## 2023-11-23 DIAGNOSIS — I152 Hypertension secondary to endocrine disorders: Secondary | ICD-10-CM | POA: Diagnosis not present

## 2023-11-23 DIAGNOSIS — E1169 Type 2 diabetes mellitus with other specified complication: Secondary | ICD-10-CM | POA: Diagnosis not present

## 2023-11-23 DIAGNOSIS — R059 Cough, unspecified: Secondary | ICD-10-CM

## 2023-11-23 DIAGNOSIS — B351 Tinea unguium: Secondary | ICD-10-CM

## 2023-11-23 DIAGNOSIS — E785 Hyperlipidemia, unspecified: Secondary | ICD-10-CM | POA: Diagnosis not present

## 2023-11-23 DIAGNOSIS — E1159 Type 2 diabetes mellitus with other circulatory complications: Secondary | ICD-10-CM

## 2023-11-23 DIAGNOSIS — E1122 Type 2 diabetes mellitus with diabetic chronic kidney disease: Secondary | ICD-10-CM

## 2023-11-23 DIAGNOSIS — L600 Ingrowing nail: Secondary | ICD-10-CM | POA: Diagnosis not present

## 2023-11-23 DIAGNOSIS — I700 Atherosclerosis of aorta: Secondary | ICD-10-CM | POA: Diagnosis not present

## 2023-11-23 LAB — BAYER DCA HB A1C WAIVED: HB A1C (BAYER DCA - WAIVED): 7.7 % — ABNORMAL HIGH (ref 4.8–5.6)

## 2023-11-23 MED ORDER — AMLODIPINE BESYLATE 2.5 MG PO TABS: 2.5000 mg | ORAL_TABLET | Freq: Every day | ORAL | 3 refills | Status: AC

## 2023-11-23 MED ORDER — DOXYCYCLINE HYCLATE 100 MG PO TABS: 100.0000 mg | ORAL_TABLET | Freq: Two times a day (BID) | ORAL | 0 refills | Status: AC

## 2023-11-23 MED ORDER — CEFDINIR 300 MG PO CAPS: 300.0000 mg | ORAL_CAPSULE | Freq: Two times a day (BID) | ORAL | 0 refills | Status: AC

## 2023-11-23 MED ORDER — BENZONATATE 100 MG PO CAPS: 100.0000 mg | ORAL_CAPSULE | Freq: Three times a day (TID) | ORAL | 0 refills | Status: DC | PRN

## 2023-11-23 MED ORDER — ROSUVASTATIN CALCIUM 10 MG PO TABS: 10.0000 mg | ORAL_TABLET | Freq: Every day | ORAL | 3 refills | Status: AC

## 2023-11-23 NOTE — Progress Notes (Signed)
 Subjective: CC:DM PCP: Raliegh Ip, DO ZOX:[id number] Schrieber is a 89 y.o. male presenting to clinic today for:  1. Type 2 Diabetes with hypertension, hyperlipidemia associated with CKD 3A:  Diabetes is diet controlled.  He is compliant with statin, Norvasc.  He is brought to the office by his daughter who is his primary caregiver. Diabetes Health Maintenance Due  Topic Date Due   OPHTHALMOLOGY EXAM  02/04/2023   FOOT EXAM  07/29/2023   HEMOGLOBIN A1C  12/13/2023    Last A1c:  Lab Results  Component Value Date   HGBA1C 7.2 (H) 06/15/2023    ROS: No shortness of breath, chest pain.  He has had a cough over a week now that seems to be progressive.  He had some rattling in his chest.  His daughter has been giving him Brooks Sailors in efforts to alleviate the cough.  He has had no hemoptysis or fevers.  2.  Spot on foot She notes that he has an ingrown toenail on the left great toe.  She has been trying to care for it independently.  No exudates, pain reported.  Was not sure if maybe it needed to be taken off or treated.   ROS: Per HPI  Allergies  Allergen Reactions   Ace Inhibitors Swelling   Past Medical History:  Diagnosis Date   Acute diastolic CHF (congestive heart failure) (HCC) 12/07/2021   CAD (coronary artery disease)    CKD (chronic kidney disease), stage III (HCC)    Hyperlipidemia    Hypertension    Ischemic cardiomyopathy     Current Outpatient Medications:    acetaminophen (TYLENOL) 500 MG tablet, Take 1 tablet (500 mg total) by mouth every 6 (six) hours as needed., Disp: 30 tablet, Rfl: 0   amLODipine (NORVASC) 2.5 MG tablet, Take 1 tablet (2.5 mg total) by mouth daily., Disp: 90 tablet, Rfl: 3   aspirin 81 MG chewable tablet, Chew 81 mg by mouth daily., Disp: , Rfl:    azelastine (ASTELIN) 0.1 % nasal spray, Place 1 spray into both nostrils 2 (two) times daily., Disp: 30 mL, Rfl: 12   blood glucose meter kit and supplies, Dispense based on patient and  insurance preference. Use up to four times daily as directed. (FOR ICD-10 E10.9, E11.9)., Disp: 1 each, Rfl: 0   Casanthranol-Docusate Sodium (STOOL SOFTENER PLUS PO), Take by mouth., Disp: , Rfl:    desloratadine (CLARINEX) 5 MG tablet, Take 1 tablet (5 mg total) by mouth daily. For allergies, Disp: 90 tablet, Rfl: 3   dorzolamide-timolol (COSOPT) 22.3-6.8 MG/ML ophthalmic solution, Place 1 drop into both eyes 2 (two) times daily., Disp: , Rfl:    glucose blood test strip, Check BGs daily. E11.9, Disp: 100 each, Rfl: 12   Lancet Device MISC, UAD to check BGs daily E11.9, Disp: 100 each, Rfl: 12   latanoprost (XALATAN) 0.005 % ophthalmic solution, Place 1 drop into both eyes at bedtime., Disp: , Rfl:    rosuvastatin (CRESTOR) 10 MG tablet, Take 1 tablet (10 mg total) by mouth daily., Disp: 90 tablet, Rfl: 3 Social History   Socioeconomic History   Marital status: Widowed    Spouse name: Not on file   Number of children: Not on file   Years of education: Not on file   Highest education level: Not on file  Occupational History   Not on file  Tobacco Use   Smoking status: Never   Smokeless tobacco: Never  Vaping Use   Vaping status: Never  Used  Substance and Sexual Activity   Alcohol use: Not Currently   Drug use: Never   Sexual activity: Not Currently  Other Topics Concern   Not on file  Social History Narrative   Not on file   Social Drivers of Health   Financial Resource Strain: Not on file  Food Insecurity: Not on file  Transportation Needs: Not on file  Physical Activity: Not on file  Stress: Not on file  Social Connections: Not on file  Intimate Partner Violence: Not on file   Family History  Problem Relation Age of Onset   Diabetes Brother    Diabetes Brother    Diabetes Brother     Objective: Office vital signs reviewed. BP (!) 145/102   Pulse (!) 120   Temp 98 F (36.7 C)   Ht 5\' 11"  (1.803 m)   Wt 177 lb (80.3 kg)   SpO2 98%   BMI 24.69 kg/m    Physical Examination:  General: Awake, alert, well nourished, nontoxic male, No acute distress HEENT: sclera white MMM Cardio: Tachycardic with regular rhythm, S1S2 heard, no murmurs appreciated Pulm: rhonchi appreciated at the right lung base.  Otherwise clear to auscultation bilaterally with normal work of breathing on room air Extremities: warm, well perfused, onychomycotic nail noted on the left great toe with appreciable ingrowing medially.  No exudates, no warmth or erythema MSK: Is in wheelchair  Assessment/ Plan: 89 y.o. male   Type 2 diabetes mellitus with stage 3a chronic kidney disease, without long-term current use of insulin (HCC) - Plan: Renal Function Panel, Bayer DCA Hb A1c Waived, VITAMIN D 25 Hydroxy (Vit-D Deficiency, Fractures), CBC  Hypertension associated with diabetes (HCC) - Plan: Renal Function Panel, amLODipine (NORVASC) 2.5 MG tablet  Hyperlipidemia associated with type 2 diabetes mellitus (HCC) - Plan: rosuvastatin (CRESTOR) 10 MG tablet  Cough in adult - Plan: DG Chest 2 View, cefdinir (OMNICEF) 300 MG capsule, doxycycline (VIBRA-TABS) 100 MG tablet  Rhonchi at right lung base - Plan: DG Chest 2 View, cefdinir (OMNICEF) 300 MG capsule, doxycycline (VIBRA-TABS) 100 MG tablet  Ingrown nail of great toe - Plan: Ambulatory referral to Podiatry  Onychomycosis - Plan: Ambulatory referral to Podiatry  Check renal function, vitamin D.  A1c at 7.7 this is appropriate for this 89 year old male.  No further treatment for diabetes at this time  His blood pressure is not at goal but again given history of orthostasis his blood pressure is quite labile.  Blood pressure is under better control at home than here.  Statin renewed for now but may consider discontinuation given advanced age and cognitive impairment.  Risk of medicine may outweigh benefit at this point  Doxycycline and Omnicef given for what I suspect is a community-acquired pneumonia.  Stat chest x-ray  was obtained to ensure that there are no other complicating features about this.  He was slightly tachycardic here in office but did not really demonstrate any other concerning features that would suggest severe infection.  I referred him to podiatry for ingrown nail with onychomycosis of the left great toenail.  Raliegh Ip, DO [facility] [phone]

## 2023-11-23 NOTE — Addendum Note (Signed)
 Addended by: Raliegh Ip on: 11/23/2023 03:57 PM   Modules accepted: Orders

## 2023-11-24 ENCOUNTER — Encounter: Payer: Self-pay | Admitting: Family Medicine

## 2023-11-24 LAB — RENAL FUNCTION PANEL
Albumin: 3.8 g/dL (ref 3.6–4.6)
BUN/Creatinine Ratio: 13 (ref 10–24)
BUN: 18 mg/dL (ref 10–36)
CO2: 21 mmol/L (ref 20–29)
Calcium: 8.9 mg/dL (ref 8.6–10.2)
Chloride: 102 mmol/L (ref 96–106)
Creatinine, Ser: 1.44 mg/dL — ABNORMAL HIGH (ref 0.76–1.27)
Glucose: 147 mg/dL — ABNORMAL HIGH (ref 70–99)
Phosphorus: 4.1 mg/dL (ref 2.8–4.1)
Potassium: 5.1 mmol/L (ref 3.5–5.2)
Sodium: 137 mmol/L (ref 134–144)
eGFR: 44 mL/min/{1.73_m2} — ABNORMAL LOW (ref >59)

## 2023-11-24 LAB — CBC
Hematocrit: 36.8 % — ABNORMAL LOW (ref 37.5–51.0)
Hemoglobin: 12.1 g/dL — ABNORMAL LOW (ref 13.0–17.7)
MCH: 29.4 pg (ref 26.6–33.0)
MCHC: 32.9 g/dL (ref 31.5–35.7)
MCV: 89 fL (ref 79–97)
Platelets: 143 10*3/uL — ABNORMAL LOW (ref 150–450)
RBC: 4.12 x10E6/uL — ABNORMAL LOW (ref 4.14–5.80)
RDW: 12.2 % (ref 11.6–15.4)
WBC: 6.8 10*3/uL (ref 3.4–10.8)

## 2023-11-24 LAB — VITAMIN D 25 HYDROXY (VIT D DEFICIENCY, FRACTURES): Vit D, 25-Hydroxy: 39.1 ng/mL (ref 30.0–100.0)

## 2023-12-02 IMAGING — DX DG CHEST 1V PORT
1 series · 1 of 1 positions shown · non-contrast
Comparison: 12/07/2021

CLINICAL DATA: Shortness of breath

EXAM:
PORTABLE CHEST 1 VIEW

[chest ap]
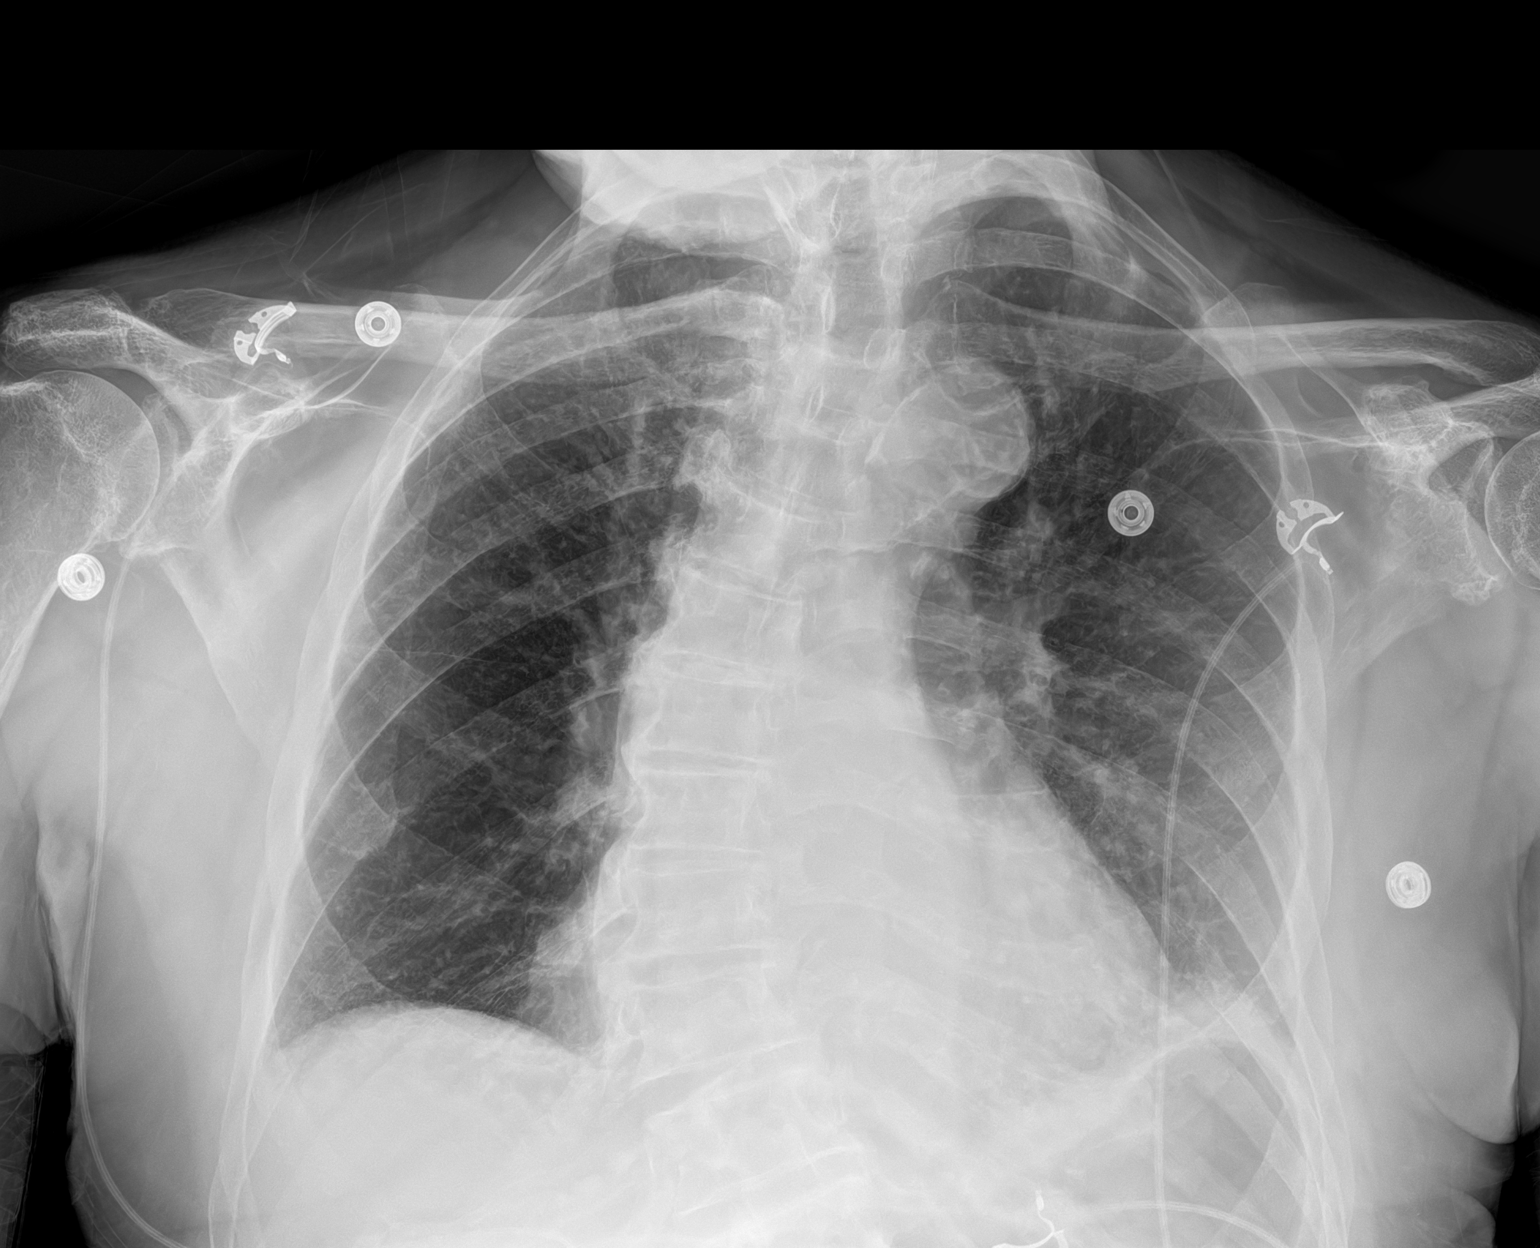

[1 of 1 positions shown; findings below may reference images not displayed]

FINDINGS: Cardiac shadow is mildly enlarged but stable. Tortuous thoracic
aorta with calcifications is seen. Lungs are well aerated
bilaterally. Minimal atelectasis in the left base is seen. No
effusions are noted. Scoliosis of the thoracic spine is again noted.
IMPRESSION: Mild left basilar atelectasis.

## 2023-12-07 ENCOUNTER — Ambulatory Visit: Payer: 59 | Admitting: Podiatry

## 2023-12-07 ENCOUNTER — Ambulatory Visit: Payer: 59 | Admitting: Family Medicine

## 2023-12-14 ENCOUNTER — Ambulatory Visit: Payer: 59 | Admitting: Podiatry

## 2023-12-14 DIAGNOSIS — H401133 Primary open-angle glaucoma, bilateral, severe stage: Secondary | ICD-10-CM | POA: Diagnosis not present

## 2023-12-28 ENCOUNTER — Ambulatory Visit: Admitting: Podiatry

## 2023-12-28 ENCOUNTER — Encounter: Payer: Self-pay | Admitting: Podiatry

## 2023-12-28 DIAGNOSIS — M79675 Pain in left toe(s): Secondary | ICD-10-CM

## 2023-12-28 DIAGNOSIS — E1142 Type 2 diabetes mellitus with diabetic polyneuropathy: Secondary | ICD-10-CM

## 2023-12-28 DIAGNOSIS — B351 Tinea unguium: Secondary | ICD-10-CM

## 2023-12-28 DIAGNOSIS — M79674 Pain in right toe(s): Secondary | ICD-10-CM | POA: Diagnosis not present

## 2023-12-28 NOTE — Progress Notes (Signed)
  Subjective:  Patient ID: Roy Ibarra, male    DOB: March 20, 1923,   MRN: [id number]  No chief complaint on file.   89 y.o. male presents for concern of thickened elongated and painful nails that are difficult to trim. Requesting to have them trimmed today. Here with daughter who relates the worse in the left great toe. Patient is diabetic and last A1c was  Lab Results  Component Value Date   HGBA1C 7.7 (H) 11/23/2023   .   PCP:  Raliegh Ip, DO    . Denies any other pedal complaints. Denies n/v/f/c.   Past Medical History:  Diagnosis Date   Acute diastolic CHF (congestive heart failure) (HCC) 12/07/2021   CAD (coronary artery disease)    CKD (chronic kidney disease), stage III (HCC)    Hyperlipidemia    Hypertension    Ischemic cardiomyopathy     Objective:  Physical Exam: Vascular: DP/PT pulses 2/4 bilateral. CFT <3 seconds. Absent hair growth on digits. Minimal edema noted to bilateral lower extremities. Xerosis noted bilaterally.  Skin. No lacerations or abrasions bilateral feet. Nails 1-5 bilateral  are thickened discolored and elongated with subungual debris. The left hallux is particularly thickened and loose with debris present. Small pressure area to posterior left heel.   Musculoskeletal: MMT 5/5 bilateral lower extremities in DF, PF, Inversion and Eversion. Deceased ROM in DF of ankle joint.  Neurological: Sensation intact to light touch. Protective sensation diminished bilateral.     Assessment:   1. Pain due to onychomycosis of toenails of both feet   2. Type 2 diabetes mellitus with peripheral neuropathy (HCC)      Plan:  Patient was evaluated and treated and all questions answered. -Discussed and educated patient on diabetic foot care, especially with  regards to the vascular, neurological and musculoskeletal systems.  -Stressed the importance of good glycemic control and the detriment of not  controlling glucose levels in relation to the  foot. -Discussed supportive shoes at all times and checking feet regularly.  -Mechanically debrided all nails 1-5 bilateral using sterile nail nipper and filed with dremel without incident  -Answered all patient questions -Patient to return  in 3 months for at risk foot care -Patient advised to call the office if any problems or questions arise in the meantime.   Louann Sjogren, DPM

## 2024-04-04 ENCOUNTER — Ambulatory Visit (INDEPENDENT_AMBULATORY_CARE_PROVIDER_SITE_OTHER): Payer: Self-pay | Admitting: Podiatry

## 2024-04-04 DIAGNOSIS — Z91199 Patient's noncompliance with other medical treatment and regimen due to unspecified reason: Secondary | ICD-10-CM

## 2024-04-04 NOTE — Progress Notes (Signed)
 Cancel 24 hours

## 2024-05-02 ENCOUNTER — Ambulatory Visit (INDEPENDENT_AMBULATORY_CARE_PROVIDER_SITE_OTHER): Admitting: Podiatry

## 2024-05-02 ENCOUNTER — Encounter: Payer: Self-pay | Admitting: Podiatry

## 2024-05-02 DIAGNOSIS — M79675 Pain in left toe(s): Secondary | ICD-10-CM

## 2024-05-02 DIAGNOSIS — M79674 Pain in right toe(s): Secondary | ICD-10-CM | POA: Diagnosis not present

## 2024-05-02 DIAGNOSIS — B351 Tinea unguium: Secondary | ICD-10-CM

## 2024-05-02 DIAGNOSIS — E1142 Type 2 diabetes mellitus with diabetic polyneuropathy: Secondary | ICD-10-CM

## 2024-05-02 NOTE — Progress Notes (Signed)
  Subjective:  Patient ID: Roy Ibarra, male    DOB: [birth year],   MRN: [id number]  Chief Complaint  Patient presents with   Diabetes    He's here for a nail trim.  Saw Dr. Jolinda - 11/23/2023; A1c - 7.7    89 y.o. male presents for concern of thickened elongated and painful nails that are difficult to trim. Requesting to have them trimmed today. Here with daughter who relates the worse in the left great toe. Patient is diabetic and last A1c was  Lab Results  Component Value Date   HGBA1C 7.7 (H) 11/23/2023   .   PCP:  Jolinda Norene HERO, DO    . Denies any other pedal complaints. Denies n/v/f/c.   Past Medical History:  Diagnosis Date   Acute diastolic CHF (congestive heart failure) (HCC) 12/07/2021   CAD (coronary artery disease)    CKD (chronic kidney disease), stage III (HCC)    Hyperlipidemia    Hypertension    Ischemic cardiomyopathy     Objective:  Physical Exam: Vascular: DP/PT pulses 2/4 bilateral. CFT <3 seconds. Absent hair growth on digits. Minimal edema noted to bilateral lower extremities. Xerosis noted bilaterally.  Skin. No lacerations or abrasions bilateral feet. Nails 1-5 bilateral  are thickened discolored and elongated with subungual debris. The left hallux is particularly thickened and loose with debris present. Small pressure area to posterior left heel.   Musculoskeletal: MMT 5/5 bilateral lower extremities in DF, PF, Inversion and Eversion. Deceased ROM in DF of ankle joint.  Neurological: Sensation intact to light touch. Protective sensation diminished bilateral.     Assessment:   1. Pain due to onychomycosis of toenails of both feet   2. Type 2 diabetes mellitus with peripheral neuropathy (HCC)      Plan:  Patient was evaluated and treated and all questions answered. -Discussed and educated patient on diabetic foot care, especially with  regards to the vascular, neurological and musculoskeletal systems.  -Stressed the importance of  good glycemic control and the detriment of not  controlling glucose levels in relation to the foot. -Discussed supportive shoes at all times and checking feet regularly.  -Mechanically debrided all nails 1-5 bilateral using sterile nail nipper and filed with dremel without incident  -Answered all patient questions -Patient to return  in 3 months for at risk foot care -Patient advised to call the office if any problems or questions arise in the meantime.   Asberry Failing, DPM

## 2024-05-23 ENCOUNTER — Ambulatory Visit: Payer: 59 | Admitting: Family Medicine

## 2024-05-23 NOTE — Progress Notes (Deleted)
 Subjective: CC:DM PCP: Roy Norene HERO, DO YEP:[id number] Roy Ibarra is a 89 y.o. male presenting to clinic today for:  1. Type 2 Diabetes with hypertension, hyperlipidemia w. CKD3a:  Glucometer:***.   High at home: ***; Low at home: ***, Taking medication(s): ***,.  Diabetes Health Maintenance Due  Topic Date Due   HEMOGLOBIN A1C  05/22/2024   OPHTHALMOLOGY EXAM  06/21/2024   FOOT EXAM  12/27/2024    Last A1c:  Lab Results  Component Value Date   HGBA1C 7.7 (H) 11/23/2023    ROS: ***dizziness, LOC, polyuria, polydipsia, unintended weight loss/gain, foot ulcerations, numbness or tingling in extremities, shortness of breath or chest pain.   ROS: Per HPI  Allergies  Allergen Reactions   Ace Inhibitors Swelling   Past Medical History:  Diagnosis Date   Acute diastolic CHF (congestive heart failure) (HCC) 12/07/2021   CAD (coronary artery disease)    CKD (chronic kidney disease), stage III (HCC)    Hyperlipidemia    Hypertension    Ischemic cardiomyopathy     Current Outpatient Medications:    acetaminophen  (TYLENOL ) 500 MG tablet, Take 1 tablet (500 mg total) by mouth every 6 (six) hours as needed., Disp: 30 tablet, Rfl: 0   amLODipine  (NORVASC ) 2.5 MG tablet, Take 1 tablet (2.5 mg total) by mouth daily., Disp: 90 tablet, Rfl: 3   aspirin  81 MG chewable tablet, Chew 81 mg by mouth daily., Disp: , Rfl:    azelastine  (ASTELIN ) 0.1 % nasal spray, Place 1 spray into both nostrils 2 (two) times daily., Disp: 30 mL, Rfl: 12   benzonatate  (TESSALON  PERLES) 100 MG capsule, Take 1 capsule (100 mg total) by mouth 3 (three) times daily as needed for cough., Disp: 20 capsule, Rfl: 0   blood glucose meter kit and supplies, Dispense based on patient and insurance preference. Use up to four times daily as directed. (FOR ICD-10 E10.9, E11.9)., Disp: 1 each, Rfl: 0   Casanthranol-Docusate Sodium (STOOL SOFTENER PLUS PO), Take by mouth., Disp: , Rfl:    desloratadine  (CLARINEX ) 5 MG  tablet, Take 1 tablet (5 mg total) by mouth daily. For allergies, Disp: 90 tablet, Rfl: 3   dorzolamide -timolol  (COSOPT ) 22.3-6.8 MG/ML ophthalmic solution, Place 1 drop into both eyes 2 (two) times daily., Disp: , Rfl:    glucose blood test strip, Check BGs daily. E11.9, Disp: 100 each, Rfl: 12   Lancet Device MISC, UAD to check BGs daily E11.9, Disp: 100 each, Rfl: 12   latanoprost  (XALATAN ) 0.005 % ophthalmic solution, Place 1 drop into both eyes at bedtime., Disp: , Rfl:    rosuvastatin  (CRESTOR ) 10 MG tablet, Take 1 tablet (10 mg total) by mouth daily., Disp: 90 tablet, Rfl: 3 Social History   Socioeconomic History   Marital status: Widowed    Spouse name: Not on file   Number of children: Not on file   Years of education: Not on file   Highest education level: Not on file  Occupational History   Not on file  Tobacco Use   Smoking status: Never   Smokeless tobacco: Never  Vaping Use   Vaping status: Never Used  Substance and Sexual Activity   Alcohol use: Not Currently   Drug use: Never   Sexual activity: Not Currently  Other Topics Concern   Not on file  Social History Narrative   Not on file   Social Drivers of Health   Financial Resource Strain: Not on file  Food Insecurity: Not on file  Transportation  Needs: Not on file  Physical Activity: Not on file  Stress: Not on file  Social Connections: Not on file  Intimate Partner Violence: Not on file   Family History  Problem Relation Age of Onset   Diabetes Brother    Diabetes Brother    Diabetes Brother     Objective: Office vital signs reviewed. There were no vitals taken for this visit.  Physical Examination:  General: Awake, alert, *** nourished, No acute distress HEENT: Normal    Neck: No masses palpated. No lymphadenopathy    Ears: Tympanic membranes intact, normal light reflex, no erythema, no bulging    Eyes: PERRLA, extraocular membranes intact, sclera ***    Nose: nasal turbinates moist, *** nasal  discharge    Throat: moist mucus membranes, no erythema, *** tonsillar exudate.  Airway is patent Cardio: regular rate and rhythm, S1S2 heard, no murmurs appreciated Pulm: clear to auscultation bilaterally, no wheezes, rhonchi or rales; normal work of breathing on room air GI: soft, non-tender, non-distended, bowel sounds present x4, no hepatomegaly, no splenomegaly, no masses GU: external vaginal tissue ***, cervix ***, *** punctate lesions on cervix appreciated, *** discharge from cervical os, *** bleeding, *** cervical motion tenderness, *** abdominal/ adnexal masses Extremities: warm, well perfused, No edema, cyanosis or clubbing; +*** pulses bilaterally MSK: *** gait and *** station Skin: dry; intact; no rashes or lesions Neuro: *** Strength and light touch sensation grossly intact, *** DTRs ***/4  Diabetic Foot Exam - Simple   No data filed      Assessment/ Plan: 89 y.o. male   Type 2 diabetes mellitus with stage 3a chronic kidney disease, without long-term current use of insulin  (HCC)  Hypertension associated with diabetes (HCC)  Hyperlipidemia associated with type 2 diabetes mellitus (HCC)   ***  [name] [name], DO [facility] [phone]

## 2024-05-26 ENCOUNTER — Encounter: Payer: Self-pay | Admitting: Family Medicine

## 2024-06-14 ENCOUNTER — Telehealth: Payer: Self-pay

## 2024-06-14 NOTE — Telephone Encounter (Signed)
 Copied from CRM [id number]. Topic: Medical Record Request - Provider/Facility Request >> Jun 14, 2024  8:53 AM Ivette P wrote: Reason for CRM: Kirke called in because would like to see if FL2 form  cane be filled out so he can be admitted this week to nursing.   And form can be sent if do not have a copy.    callback [phone]

## 2024-06-14 NOTE — Telephone Encounter (Signed)
FL2 started

## 2024-06-14 NOTE — Telephone Encounter (Signed)
 Can we start one of these for him please?

## 2024-06-15 ENCOUNTER — Telehealth: Admitting: Family Medicine

## 2024-06-15 ENCOUNTER — Encounter: Payer: Self-pay | Admitting: Family Medicine

## 2024-06-15 DIAGNOSIS — R059 Cough, unspecified: Secondary | ICD-10-CM

## 2024-06-15 DIAGNOSIS — R5381 Other malaise: Secondary | ICD-10-CM | POA: Diagnosis not present

## 2024-06-15 DIAGNOSIS — R404 Transient alteration of awareness: Secondary | ICD-10-CM | POA: Diagnosis not present

## 2024-06-15 DIAGNOSIS — J3489 Other specified disorders of nose and nasal sinuses: Secondary | ICD-10-CM

## 2024-06-15 MED ORDER — BENZONATATE 100 MG PO CAPS: 100.0000 mg | ORAL_CAPSULE | Freq: Three times a day (TID) | ORAL | 0 refills | Status: AC | PRN

## 2024-06-15 MED ORDER — DOXYCYCLINE HYCLATE 100 MG PO TABS: 100.0000 mg | ORAL_TABLET | Freq: Two times a day (BID) | ORAL | 0 refills | Status: AC

## 2024-06-15 MED ORDER — DESLORATADINE 5 MG PO TABS: 5.0000 mg | ORAL_TABLET | Freq: Every day | ORAL | 3 refills | Status: AC

## 2024-06-15 NOTE — Progress Notes (Signed)
 MyChart Video visit  Subjective: CC:URI/ debility PCP: Jolinda Norene HERO, DO YEP:[id number] Okuda is a 89 y.o. male. Patient provides verbal consent for consult held via video.  Due to COVID-19 pandemic this visit was conducted virtually. This visit type was conducted due to national recommendations for restrictions regarding the COVID-19 Pandemic (e.g. social distancing, sheltering in place) in an effort to limit this patient's exposure and mitigate transmission in our community. All issues noted in this document were discussed and addressed.  A physical exam was not performed with this format.   Location of patient: home Location of provider: [facility] Others present for call: daughter, Rollene Pesa  1.  Physical deconditioning Patient initially presenting for discussion of possible placement as he had quite a decline in physical status after moving from his old home to a new home with his daughter.  She notes that prior to the move he was up and walking with a walker and acting his normal self but after the move he started declining and acting like he did not really want to get up and move nor engage much.  He has gotten up a little bit more now and is not really lying in the bed so she is not wanting to proceed with placement at [facility] yet but wants to keep this as an option because she herself will be undergoing a procedure that will not allow her to provide him total care over the next 4 weeks.  Her sister will be helping him in her place for now.  He is dependent currently for meal preparation, assistance with bathing, dressing and toileting.  He utilizes depends for urinary and fecal incontinence but does feel when he needs to go.  He is intermittently disoriented  She also reports that he has been having some increasing cough.  They have not been as compliant with the Flonase as they usually are but are starting to increase usage of this again.  No reports of fevers, hemoptysis  or brown sputum.  No labored breathing.   ROS: Per HPI  Allergies  Allergen Reactions   Ace Inhibitors Swelling   Past Medical History:  Diagnosis Date   Acute diastolic CHF (congestive heart failure) (HCC) 12/07/2021   CAD (coronary artery disease)    CKD (chronic kidney disease), stage III (HCC)    Hyperlipidemia    Hypertension    Ischemic cardiomyopathy     Current Outpatient Medications:    acetaminophen  (TYLENOL ) 500 MG tablet, Take 1 tablet (500 mg total) by mouth every 6 (six) hours as needed., Disp: 30 tablet, Rfl: 0   amLODipine  (NORVASC ) 2.5 MG tablet, Take 1 tablet (2.5 mg total) by mouth daily., Disp: 90 tablet, Rfl: 3   aspirin  81 MG chewable tablet, Chew 81 mg by mouth daily., Disp: , Rfl:    azelastine  (ASTELIN ) 0.1 % nasal spray, Place 1 spray into both nostrils 2 (two) times daily., Disp: 30 mL, Rfl: 12   benzonatate  (TESSALON  PERLES) 100 MG capsule, Take 1 capsule (100 mg total) by mouth 3 (three) times daily as needed for cough., Disp: 20 capsule, Rfl: 0   blood glucose meter kit and supplies, Dispense based on patient and insurance preference. Use up to four times daily as directed. (FOR ICD-10 E10.9, E11.9)., Disp: 1 each, Rfl: 0   Casanthranol-Docusate Sodium (STOOL SOFTENER PLUS PO), Take by mouth., Disp: , Rfl:    desloratadine  (CLARINEX ) 5 MG tablet, Take 1 tablet (5 mg total) by mouth daily. For allergies,  Disp: 90 tablet, Rfl: 3   dorzolamide -timolol  (COSOPT ) 22.3-6.8 MG/ML ophthalmic solution, Place 1 drop into both eyes 2 (two) times daily., Disp: , Rfl:    glucose blood test strip, Check BGs daily. E11.9, Disp: 100 each, Rfl: 12   Lancet Device MISC, UAD to check BGs daily E11.9, Disp: 100 each, Rfl: 12   latanoprost  (XALATAN ) 0.005 % ophthalmic solution, Place 1 drop into both eyes at bedtime., Disp: , Rfl:    rosuvastatin  (CRESTOR ) 10 MG tablet, Take 1 tablet (10 mg total) by mouth daily., Disp: 90 tablet, Rfl: 3  Gen: tired appearing elderly male.  Rouses but is drowsy Pulm: normal work of breathing on room air. No observed wheezes/ coughing MSK: gait not assessed. Pt is resting in a chair  Assessment/ Plan: 89 y.o. male   Cough in adult - Plan: doxycycline  (VIBRA -TABS) 100 MG tablet, benzonatate  (TESSALON  PERLES) 100 MG capsule, desloratadine  (CLARINEX ) 5 MG tablet  Rhinorrhea - Plan: desloratadine  (CLARINEX ) 5 MG tablet  Physical deconditioning  Transient alteration of awareness  Doxycycline  given for empiric use.  Tessalon  Perles, Clarinex .  Continue Flonase  FL 2 completed and returned to [facility] time: 9:42a End time: 9:49a  Total time spent on patient care (including video visit/ documentation): 12 minutes  Dominga Mcduffie [name], DO [facility] [phone]

## 2024-06-15 NOTE — Telephone Encounter (Signed)
 Holding off on FL2 per PCP at this time.

## 2024-06-16 NOTE — Telephone Encounter (Signed)
 FL2 faxed to [facility] at [phone]

## 2024-06-17 ENCOUNTER — Encounter: Payer: Self-pay | Admitting: Internal Medicine

## 2024-06-17 ENCOUNTER — Non-Acute Institutional Stay (SKILLED_NURSING_FACILITY): Payer: Self-pay | Admitting: Internal Medicine

## 2024-06-17 DIAGNOSIS — E1151 Type 2 diabetes mellitus with diabetic peripheral angiopathy without gangrene: Secondary | ICD-10-CM

## 2024-06-17 DIAGNOSIS — E785 Hyperlipidemia, unspecified: Secondary | ICD-10-CM

## 2024-06-17 DIAGNOSIS — N1832 Chronic kidney disease, stage 3b: Secondary | ICD-10-CM

## 2024-06-17 DIAGNOSIS — R531 Weakness: Secondary | ICD-10-CM

## 2024-06-17 DIAGNOSIS — R627 Adult failure to thrive: Secondary | ICD-10-CM | POA: Diagnosis not present

## 2024-06-17 DIAGNOSIS — N183 Chronic kidney disease, stage 3 unspecified: Secondary | ICD-10-CM | POA: Insufficient documentation

## 2024-06-17 NOTE — Assessment & Plan Note (Signed)
 11/23/2023 GFR was 44 and creatinine 1.44 indicating high stage IIIb CKD.  Renal function will be updated.

## 2024-06-17 NOTE — Patient Instructions (Signed)
 See assessment and plan under each diagnosis in the problem list and acutely for this visit

## 2024-06-17 NOTE — Progress Notes (Unsigned)
 NURSING HOME LOCATION:  [facility] ROOM NUMBER:  [id number]  CODE STATUS:  DNR (06/16/2023)  PCP:  Rosina Fielding DO  This is a comprehensive admission note to this SNFperformed on this date less than 30 days from date of admission. Included are preadmission medical/surgical history; reconciled medication list; family history; social history and comprehensive review of systems.  Corrections and additions to the records were documented. Comprehensive physical exam was also performed. Additionally a clinical summary was entered for each active diagnosis pertinent to this admission in the Problem List to enhance continuity of care.  HPI: He has been transferred from home for respite for 2 weeks because of adult failure to thrive.  Also his daughter, his primary caregiver is scheduled to have surgery and cannot provide care for possibly up to 4 weeks. He has exhibited a decline in physical status after relocating from his home to his daughter's home.  Prior to admission he was reported as up and walking with a walker; after relocation he exhibited decreased initiative to mobilize or communicate. He had been dependent for meal preparation; required assistance with bathing, dressing, and toileting.  Depends are worn for urinary and fecal incontinence.  He has exhibited intermittent disorientation. Apparently he has had increasing cough and was seen for a video visit by his PCP 06/15/2024; she prescribed doxycycline  and Tessalon  Perles.  Past medical and surgical history: Includes essential hypertension; dyslipidemia; history of diastolic congestive heart failure; history of ischemic cardiomyopathy; CAD; and CKD stage III. Surgeries and procedures include inguinal hernia repair; prostate biopsy; and vascular stent placement.  Most recent labs were completed 11/23/2023; creatinine was 1.44 and GFR 44 indicating high stage IIIb CKD.  Normochromic, normocytic anemia was present with H/H of  12.1/36.8.  Platelet count was mildly reduced at 143,000.  Diabetic control is adequate based on A1c of 7.7%.  The most recent TSH on record was therapeutic at 1.337; but this was completed 12/08/2021.  Family history: reviewed, non contributory due to advanced age.  Social history: Non-smoker; nondrinker currently.   Review of systems: Dr. Antonetta, his daughter provided the history.  She stated that he has been less stable in reference to mobilization with a gradual decline.  She describes variable cognitive functioning.  Physical exam:  Pertinent or positive findings: He was essentially nonverbal.  He  appears much younger than stated age; he has no facial wrinkling.  Pattern alopecia is present.  Eyebrows are essentially absent.  Arcus senilis is present.  Dental hygiene is immaculate.  He has minimal low-grade rales bilaterally.  Intermittently he had a brief, nonproductive cough.  Grade 12 systolic murmur suggested at the right base with increase in the second heart sound and splitting of the second heart sound intermittently.  During auscultation of the heart there was a brief tachycardic burst which resolved spontaneously.  Pedal pulses are decreased.  He had difficulty following commands.  Strength to opposition appeared to be fair-good except possibly in the left upper extremity.  General appearance: Adequately nourished; no acute distress, increased work of breathing is present.   Lymphatic: No lymphadenopathy about the head, neck, axilla. Eyes: No conjunctival inflammation or lid edema is present. There is no scleral icterus. Ears:  External ear exam shows no significant lesions or deformities.   Nose:  External nasal examination shows no deformity or inflammation. Nasal mucosa are pink and moist without lesions, exudates Neck:  No thyromegaly, masses, tenderness noted.    Heart:  No gallop,click, rub.  Lungs:  without wheezes, rhonchi, rubs. Abdomen: Bowel sounds are normal.  Abdomen is  soft and nontender with no organomegaly, hernias, masses. GU: Deferred  Extremities:  No cyanosis, clubbing, edema. Neurologic exam: Balance, Rhomberg, finger to nose testing could not be completed due to clinical state Skin: Warm & dry w/o tenting. No significant lesions or rash.  See clinical summary under each active problem in the Problem List with associated updated therapeutic plan:  Adult failure to thrive syndrome Labs will be updated with special attention to  CBC,serum albumin, total protein, and TSH.  Diabetes mellitus with peripheral vascular disease (HCC) A1c was 7.7% on 11/23/2023.  A1c will be updated to verify adequate diabetic control.  Fasting blood sugars will be checked.  Generalized weakness Etiology is most likely multifactorial including advanced age and possible other active medical processes.  TSH, albumin, and total protein will be updated.  PT/OT at SNF as tolerated. Strength to opposition appears to be fair-good except for possible decrease in the left upper extremity.  Assessment was hampered by his inability to optimally follow commands.  He is on low-dose hydrophilic statin; statin induced myalgia is very unlikely.  Chronic kidney disease (CKD), stage III (moderate) (HCC) 11/23/2023 GFR was 44 and creatinine 1.44 indicating high stage IIIb CKD.  Renal function will be updated.  Dyslipidemia Lipids will be updated as he is on low-dose hydrophilic statin.

## 2024-06-17 NOTE — Assessment & Plan Note (Signed)
 Lipids will be updated as he is on low-dose hydrophilic statin.

## 2024-06-17 NOTE — Progress Notes (Deleted)
   NURSING HOME LOCATION:  [facility] ROOM NUMBER: 137  CODE STATUS:  Full Code  PCP:  Rosina  This is a comprehensive admission note to this SNFperformed on this date less than 30 days from date of admission. Included are preadmission medical/surgical history; reconciled medication list; family history; social history and comprehensive review of systems.  Corrections and additions to the records were documented. Comprehensive physical exam was also performed. Additionally a clinical summary was entered for each active diagnosis pertinent to this admission in the Problem List to enhance continuity of care.  HPI:  Past medical and surgical history:  Family history: reviewed, non contributory due to advanced age.  Social history:   Review of systems: Clinical neurocognitive deficits made validity of responses questionable , compromising ROS completion. Date given as Constitutional: No fever, significant weight change, fatigue  Eyes: No redness, discharge, pain, vision change ENT/mouth: No nasal congestion, purulent discharge, earache, change in hearing, sore throat  Cardiovascular: No chest pain, palpitations, paroxysmal nocturnal dyspnea, claudication, edema  Respiratory: No cough, sputum production, hemoptysis, DOE, significant snoring, apnea Gastrointestinal: No heartburn, dysphagia, abdominal pain, nausea /vomiting, rectal bleeding, melena, change in bowels Genitourinary: No dysuria, hematuria, pyuria, incontinence, nocturia Musculoskeletal: No joint stiffness, joint swelling, weakness, pain Dermatologic: No rash, pruritus, change in appearance of skin Neurologic: No dizziness, headache, syncope, seizures, numbness, tingling Psychiatric: No significant anxiety, depression, insomnia, anorexia Endocrine: No change in hair/skin/nails, excessive thirst, excessive hunger, excessive urination  Hematologic/lymphatic: No significant bruising, lymphadenopathy, abnormal  bleeding Allergy/immunology: No itchy/watery eyes, significant sneezing, urticaria, angioedema  Physical exam:  Pertinent or positive findings: General appearance: Adequately nourished; no acute distress, increased work of breathing is present.   Lymphatic: No lymphadenopathy about the head, neck, axilla. Eyes: No conjunctival inflammation or lid edema is present. There is no scleral icterus. Ears:  External ear exam shows no significant lesions or deformities.   Nose:  External nasal examination shows no deformity or inflammation. Nasal mucosa are pink and moist without lesions, exudates Oral exam: Lips and gums are healthy appearing.There is no oropharyngeal erythema or exudate. Neck:  No thyromegaly, masses, tenderness noted.    Heart:  Normal rate and regular rhythm. S1 and S2 normal without gallop, murmur, click, rub.  Lungs: Chest clear to auscultation without wheezes, rhonchi, rales, rubs. Abdomen: Bowel sounds are normal.  Abdomen is soft and nontender with no organomegaly, hernias, masses. GU: Deferred  Extremities:  No cyanosis, clubbing, edema. Neurologic exam:  Strength equal  in upper & lower extremities. Balance, Rhomberg, finger to nose testing could not be completed due to clinical state Deep tendon reflexes are equal Skin: Warm & dry w/o tenting. No significant lesions or rash.  See clinical summary under each active problem in the Problem List with associated updated therapeutic plan

## 2024-06-17 NOTE — Assessment & Plan Note (Addendum)
 LabsCBC to get him will be updated with special attention to serum albumin, total protein, and TSH.

## 2024-06-17 NOTE — Assessment & Plan Note (Addendum)
 Etiology is most likely multifactorial including advanced age and possible other active medical processes.  TSH, albumin, and total protein will be updated.  PT/OT at SNF as tolerated. Strength to opposition appears to be fair-good except for possible decrease in the left upper extremity.  Assessment was hampered by his inability to optimally follow commands.  He is on low-dose hydrophilic statin; statin induced myalgia is very unlikely.

## 2024-06-17 NOTE — Assessment & Plan Note (Signed)
 A1c was 7.7% on 11/23/2023.  A1c will be updated to verify adequate diabetic control.  Fasting blood sugars will be checked.

## 2024-06-20 ENCOUNTER — Telehealth: Payer: Self-pay

## 2024-06-20 ENCOUNTER — Other Ambulatory Visit (HOSPITAL_COMMUNITY)
Admission: RE | Admit: 2024-06-20 | Discharge: 2024-06-20 | Disposition: A | Discharge status: Home / Self Care | Source: Skilled Nursing Facility | Attending: Adult Health | Admitting: Adult Health

## 2024-06-20 DIAGNOSIS — E785 Hyperlipidemia, unspecified: Secondary | ICD-10-CM | POA: Diagnosis not present

## 2024-06-20 LAB — LIPID PANEL
Cholesterol: 79 mg/dL (ref 0–200)
HDL: 33 mg/dL — ABNORMAL LOW (ref >40)
LDL Cholesterol: 40 mg/dL (ref 0–99)
Total CHOL/HDL Ratio: 2.4 ratio
Triglycerides: 31 mg/dL (ref <150)
VLDL: 6 mg/dL (ref 0–40)

## 2024-06-20 LAB — COMPREHENSIVE METABOLIC PANEL WITH GFR
ALT: 34 U/L (ref 0–44)
AST: 38 U/L (ref 15–41)
Albumin: 2.9 g/dL — ABNORMAL LOW (ref 3.5–5.0)
Alkaline Phosphatase: 62 U/L (ref 38–126)
Anion gap: 12 (ref 5–15)
BUN: 16 mg/dL (ref 8–23)
CO2: 21 mmol/L — ABNORMAL LOW (ref 22–32)
Calcium: 8.8 mg/dL — ABNORMAL LOW (ref 8.9–10.3)
Chloride: 98 mmol/L (ref 98–111)
Creatinine, Ser: 1.24 mg/dL (ref 0.61–1.24)
GFR, Estimated: 53 mL/min — ABNORMAL LOW (ref >60)
Glucose, Bld: 167 mg/dL — ABNORMAL HIGH (ref 70–99)
Potassium: 4.2 mmol/L (ref 3.5–5.1)
Sodium: 131 mmol/L — ABNORMAL LOW (ref 135–145)
Total Bilirubin: 1.2 mg/dL (ref 0.0–1.2)
Total Protein: 7.8 g/dL (ref 6.5–8.1)

## 2024-06-20 LAB — CBC
HCT: 34.2 % — ABNORMAL LOW (ref 39.0–52.0)
Hemoglobin: 11.7 g/dL — ABNORMAL LOW (ref 13.0–17.0)
MCH: 29.3 pg (ref 26.0–34.0)
MCHC: 34.2 g/dL (ref 30.0–36.0)
MCV: 85.5 fL (ref 80.0–100.0)
Platelets: 251 K/uL (ref 150–400)
RBC: 4 MIL/uL — ABNORMAL LOW (ref 4.22–5.81)
RDW: 13.7 % (ref 11.5–15.5)
WBC: 7.9 K/uL (ref 4.0–10.5)
nRBC: 0 % (ref 0.0–0.2)

## 2024-06-20 LAB — HEMOGLOBIN A1C
Hgb A1c MFr Bld: 7.5 % — ABNORMAL HIGH (ref 4.8–5.6)
Mean Plasma Glucose: 168.55 mg/dL

## 2024-06-20 LAB — TSH: TSH: 1.809 u[IU]/mL (ref 0.350–4.500)

## 2024-06-20 NOTE — Telephone Encounter (Signed)
 Good Morning Suzen   I am reaching out to you as Dr Talmadge Nurse re my father Roy Ibarra Apr 08, 2023   - I am not sure how much help I would be and Dr. KANDICE is out of the office today.     I have decided that he needs to be in the SNF  and I called the facility [facility], they said that they need the [facility], Dr KANDICE had told me at his recent  video visit that I should message you if I need anything. The goal is to have him admitted there no later than tomorrow so form  needed today pLEASE   -Please send a MyChart message in his chart so I can forward to Dr KANDICE and she can advise when she returns to the office tomorrow.    Just to clarify, are you sure that there are noFL2 forms already signed for him, I will send a mY chart message if needed ,  just checking     -The paperwork will be ready for pick up this afternoon.    Thank you VERY MARCIANO Suzen. What time can I collect it?

## 2024-06-20 NOTE — Progress Notes (Signed)
 This encounter was created in error - please disregard.

## 2024-07-08 ENCOUNTER — Non-Acute Institutional Stay (SKILLED_NURSING_FACILITY): Admitting: Adult Health

## 2024-07-08 ENCOUNTER — Encounter: Payer: Self-pay | Admitting: Adult Health

## 2024-07-08 DIAGNOSIS — R627 Adult failure to thrive: Secondary | ICD-10-CM | POA: Diagnosis not present

## 2024-07-08 DIAGNOSIS — E1122 Type 2 diabetes mellitus with diabetic chronic kidney disease: Secondary | ICD-10-CM | POA: Diagnosis not present

## 2024-07-08 DIAGNOSIS — I152 Hypertension secondary to endocrine disorders: Secondary | ICD-10-CM

## 2024-07-08 DIAGNOSIS — N183 Chronic kidney disease, stage 3 unspecified: Secondary | ICD-10-CM | POA: Diagnosis not present

## 2024-07-08 DIAGNOSIS — E1159 Type 2 diabetes mellitus with other circulatory complications: Secondary | ICD-10-CM

## 2024-07-08 NOTE — Progress Notes (Signed)
 Location:  [facility] Room Number: 137-P Place of Service:  SNF (31) Provider: Barnie Seip, NP  CODE STATUS: DNR  Allergies  Allergen Reactions   Ace Inhibitors Swelling    Chief Complaint  Patient presents with   Care plan meeting    HPI:  We have come together for his care plan meeting. Family present.  BIMS no; mood; no; combative at times, improving. He is using wheelchair; is nonambulatory without falls. He is dependent with adl care. He is frequently incontinent of bladder and bowel. Dietary: requires assist with meals. D2 diet soft vegetables; appetite 26-75% weight is 158 pounds; he is losing weight. This can been expected with his environmental changes. is on supplements three times daily. Therapy: ot: improve independence with self feeding does vary; tioleting dressing bathing and ambulation 30 feet mod assist. ST: swallowing and cognition. Activities: in room and morning coffee. He will continue to be followed for his chronic illnesses including:  Hypertension associated with type 2 diabetes mellitus    Adult failure to thrive in adult    CKD stage 3 due to type 2 diabetes mellitus  Past Medical History:  Diagnosis Date   Acute diastolic CHF (congestive heart failure) (HCC) 12/07/2021   CAD (coronary artery disease)    CKD (chronic kidney disease), stage III (HCC)    Hyperlipidemia    Hypertension    Ischemic cardiomyopathy     Past Surgical History:  Procedure Laterality Date   INGUINAL HERNIA REPAIR     PROSTATE BIOPSY     STENT PLACEMENT VASCULAR ([facility] HX)  11/2017    Social History   Socioeconomic History   Marital status: Widowed    Spouse name: Not on file   Number of children: Not on file   Years of education: Not on file   Highest education level: Not on file  Occupational History   Not on file  Tobacco Use   Smoking status: Never   Smokeless tobacco: Never  Vaping Use   Vaping status: Never Used  Substance and Sexual  Activity   Alcohol use: Not Currently   Drug use: Never   Sexual activity: Not Currently  Other Topics Concern   Not on file  Social History Narrative   Not on file   Social Drivers of Health   Financial Resource Strain: Not on file  Food Insecurity: Not on file  Transportation Needs: Not on file  Physical Activity: Not on file  Stress: Not on file  Social Connections: Not on file  Intimate Partner Violence: Not on file   Family History  Problem Relation Age of Onset   Diabetes Brother    Diabetes Brother    Diabetes Brother       VITAL SIGNS BP (!) 148/66   Pulse (!) 51   Temp 97.7 F (36.5 C)   Resp 16   Ht 5' 11 (1.803 m)   Wt 158 lb 9.6 oz (71.9 kg)   SpO2 97%   BMI 22.12 kg/m   Outpatient Encounter Medications as of 07/08/2024  Medication Sig   acetaminophen  (TYLENOL ) 500 MG tablet Take 1 tablet (500 mg total) by mouth every 6 (six) hours as needed.   amLODipine  (NORVASC ) 2.5 MG tablet Take 1 tablet (2.5 mg total) by mouth daily.   aspirin  81 MG chewable tablet Chew 81 mg by mouth daily.   azelastine  (ASTELIN ) 0.1 % nasal spray Place 1 spray into both nostrils 2 (two) times daily.   dorzolamide -timolol  (  COSOPT ) 22.3-6.8 MG/ML ophthalmic solution Place 1 drop into both eyes 2 (two) times daily.   latanoprost  (XALATAN ) 0.005 % ophthalmic solution Place 1 drop into both eyes at bedtime.   loratadine (CLARITIN) 10 MG tablet Take 10 mg by mouth daily.   rosuvastatin  (CRESTOR ) 10 MG tablet Take 1 tablet (10 mg total) by mouth daily.   sennosides-docusate sodium (SENOKOT-S) 8.6-50 MG tablet Take 1 tablet by mouth at bedtime.   benzonatate  (TESSALON  PERLES) 100 MG capsule Take 1 capsule (100 mg total) by mouth 3 (three) times daily as needed for cough. (Patient not taking: Reported on 07/08/2024)   blood glucose meter kit and supplies Dispense based on patient and insurance preference. Use up to four times daily as directed. (FOR ICD-10 E10.9, E11.9).    Casanthranol-Docusate Sodium (STOOL SOFTENER PLUS PO) Take by mouth. (Patient not taking: Reported on 07/08/2024)   desloratadine  (CLARINEX ) 5 MG tablet Take 1 tablet (5 mg total) by mouth daily. For allergies (Patient not taking: Reported on 07/08/2024)   glucose blood test strip Check BGs daily. E11.9   Lancet Device MISC UAD to check BGs daily E11.9   No facility-administered encounter medications on file as of 07/08/2024.     SIGNIFICANT DIAGNOSTIC EXAMS  Review of Systems  Reason unable to perform ROS: unable to fully participate.    Physical Exam Constitutional:      General: He is not in acute distress.    Appearance: He is well-developed. He is not diaphoretic.  Neck:     Thyroid : No thyromegaly.  Cardiovascular:     Rate and Rhythm: Normal rate and regular rhythm.     Heart sounds: Normal heart sounds.  Pulmonary:     Effort: Pulmonary effort is normal. No respiratory distress.     Breath sounds: Normal breath sounds.  Abdominal:     General: Bowel sounds are normal. There is no distension.     Palpations: Abdomen is soft.     Tenderness: There is no abdominal tenderness.  Musculoskeletal:        General: Normal range of motion.     Cervical back: Neck supple.     Right lower leg: No edema.     Left lower leg: No edema.     Comments: Has tremors  Lymphadenopathy:     Cervical: No cervical adenopathy.  Skin:    General: Skin is warm and dry.  Neurological:     Mental Status: He is alert. Mental status is at baseline.  Psychiatric:        Mood and Affect: Mood normal.      ASSESSMENT/ PLAN:  TODAY  Hypertension associated with type 2 diabetes mellitus Adult failure to thrive in adult CKD stage 3 due to type 2 diabetes mellitus  Will continue current medications Will continue current plan of care Will continue to monitor his status.  Goal of long term care   Time spent with patient 40 minutes: Dietary; therapy; activities MOST FORM FILLED OUT (20  minutes)     Barnie Seip NP [facility]  call [phone]

## 2024-07-13 DIAGNOSIS — H401134 Primary open-angle glaucoma, bilateral, indeterminate stage: Secondary | ICD-10-CM | POA: Diagnosis not present

## 2024-07-13 DIAGNOSIS — Z961 Presence of intraocular lens: Secondary | ICD-10-CM | POA: Diagnosis not present

## 2024-07-25 ENCOUNTER — Ambulatory Visit: Admitting: Family Medicine

## 2024-07-28 ENCOUNTER — Non-Acute Institutional Stay (SKILLED_NURSING_FACILITY): Payer: Self-pay | Admitting: Adult Health

## 2024-07-28 ENCOUNTER — Encounter: Payer: Self-pay | Admitting: Adult Health

## 2024-07-28 DIAGNOSIS — I152 Hypertension secondary to endocrine disorders: Secondary | ICD-10-CM

## 2024-07-28 DIAGNOSIS — J3089 Other allergic rhinitis: Secondary | ICD-10-CM | POA: Diagnosis not present

## 2024-07-28 DIAGNOSIS — H40053 Ocular hypertension, bilateral: Secondary | ICD-10-CM

## 2024-07-28 DIAGNOSIS — E1169 Type 2 diabetes mellitus with other specified complication: Secondary | ICD-10-CM | POA: Diagnosis not present

## 2024-07-28 DIAGNOSIS — E1159 Type 2 diabetes mellitus with other circulatory complications: Secondary | ICD-10-CM | POA: Diagnosis not present

## 2024-07-28 DIAGNOSIS — E1122 Type 2 diabetes mellitus with diabetic chronic kidney disease: Secondary | ICD-10-CM | POA: Diagnosis not present

## 2024-07-28 DIAGNOSIS — E1151 Type 2 diabetes mellitus with diabetic peripheral angiopathy without gangrene: Secondary | ICD-10-CM

## 2024-07-28 DIAGNOSIS — E785 Hyperlipidemia, unspecified: Secondary | ICD-10-CM

## 2024-07-28 DIAGNOSIS — N183 Chronic kidney disease, stage 3 unspecified: Secondary | ICD-10-CM

## 2024-07-28 DIAGNOSIS — E440 Moderate protein-calorie malnutrition: Secondary | ICD-10-CM

## 2024-07-28 NOTE — Progress Notes (Signed)
 Location:  [facility] Room Number: [id number] Place of Service:  SNF (31)   CODE STATUS: dnr   Allergies  Allergen Reactions   Ace Inhibitors Swelling    Chief Complaint  Patient presents with   Medical Management of Chronic Issues              Hypertension associated with type 2 diabetes mellitus:       Diabetes mellitus with peripheral vascular disease:     Hyperlipidemia associated with type 2 diabetes mellitus    HPI:  He is a 89 y.o. long term resident of this facility being seen for the management of his chronic illnesses:   Hypertension associated with type 2 diabetes mellitus:       Diabetes mellitus with peripheral vascular disease:     Hyperlipidemia associated with type 2 diabetes mellitus.  There are no reports of uncontrolled pain.  He continues with statin for his hyperlipidemia. There are no reports of anxiety.    Past Medical History:  Diagnosis Date   Acute diastolic CHF (congestive heart failure) (HCC) 12/07/2021   CAD (coronary artery disease)    CKD (chronic kidney disease), stage III (HCC)    Hyperlipidemia    Hypertension    Ischemic cardiomyopathy     Past Surgical History:  Procedure Laterality Date   INGUINAL HERNIA REPAIR     PROSTATE BIOPSY     STENT PLACEMENT VASCULAR ([facility] HX)  11/2017    Social History   Socioeconomic History   Marital status: Widowed    Spouse name: Not on file   Number of children: Not on file   Years of education: Not on file   Highest education level: Not on file  Occupational History   Not on file  Tobacco Use   Smoking status: Never   Smokeless tobacco: Never  Vaping Use   Vaping status: Never Used  Substance and Sexual Activity   Alcohol use: Not Currently   Drug use: Never   Sexual activity: Not Currently  Other Topics Concern   Not on file  Social History Narrative   Not on file   Social Drivers of Health   Financial Resource Strain: Not on file  Food Insecurity: Not on file   Transportation Needs: Not on file  Physical Activity: Not on file  Stress: Not on file  Social Connections: Not on file  Intimate Partner Violence: Not on file   Family History  Problem Relation Age of Onset   Diabetes Brother    Diabetes Brother    Diabetes Brother       VITAL SIGNS BP 118/77   Pulse 97   Temp 98.2 F (36.8 C)   Resp 18   Ht 5' 10 (1.778 m)   Wt 158 lb 6.4 oz (71.8 kg)   SpO2 97%   BMI 22.73 kg/m   Outpatient Encounter Medications as of 07/28/2024  Medication Sig   acetaminophen  (TYLENOL ) 500 MG tablet Take 1 tablet (500 mg total) by mouth every 6 (six) hours as needed.   amLODipine  (NORVASC ) 2.5 MG tablet Take 1 tablet (2.5 mg total) by mouth daily.   aspirin  81 MG chewable tablet Chew 81 mg by mouth daily.   azelastine  (ASTELIN ) 0.1 % nasal spray Place 1 spray into both nostrils 2 (two) times daily.   benzonatate  (TESSALON  PERLES) 100 MG capsule Take 1 capsule (100 mg total) by mouth 3 (three) times daily as needed for cough. (Patient not taking: Reported  on 07/08/2024)   blood glucose meter kit and supplies Dispense based on patient and insurance preference. Use up to four times daily as directed. (FOR ICD-10 E10.9, E11.9).   Casanthranol-Docusate Sodium (STOOL SOFTENER PLUS PO) Take by mouth. (Patient not taking: Reported on 07/08/2024)   desloratadine  (CLARINEX ) 5 MG tablet Take 1 tablet (5 mg total) by mouth daily. For allergies (Patient not taking: Reported on 07/08/2024)   dorzolamide -timolol  (COSOPT ) 22.3-6.8 MG/ML ophthalmic solution Place 1 drop into both eyes 2 (two) times daily.   glucose blood test strip Check BGs daily. E11.9   Lancet Device MISC UAD to check BGs daily E11.9   latanoprost  (XALATAN ) 0.005 % ophthalmic solution Place 1 drop into both eyes at bedtime.   loratadine (CLARITIN) 10 MG tablet Take 10 mg by mouth daily.   rosuvastatin  (CRESTOR ) 10 MG tablet Take 1 tablet (10 mg total) by mouth daily.   sennosides-docusate sodium  (SENOKOT-S) 8.6-50 MG tablet Take 1 tablet by mouth at bedtime.   No facility-administered encounter medications on file as of 07/28/2024.     SIGNIFICANT DIAGNOSTIC EXAMS  LABS  06-20-24: wbc 7.9; hgb 11.7; hct 34.2; mcv 85.5 plt 251; glucose 167; bun 16; creat 1.24; k+ 4.2; na++ 131; ca 8.8; gfr 53; protein 7.8 albumin 2.9; hgb A1c 7.5; tsh 1.809; chol 79; ldl 40; trig 31; hdl 33  Review of Systems  Reason unable to perform ROS: unable to fully participate.    Physical Exam Constitutional:      General: He is not in acute distress.    Appearance: He is well-developed. He is not diaphoretic.  Neck:     Thyroid : No thyromegaly.  Cardiovascular:     Rate and Rhythm: Normal rate and regular rhythm.     Heart sounds: Normal heart sounds.  Pulmonary:     Effort: Pulmonary effort is normal. No respiratory distress.     Breath sounds: Normal breath sounds.  Abdominal:     General: Bowel sounds are normal. There is no distension.     Palpations: Abdomen is soft.     Tenderness: There is no abdominal tenderness.  Musculoskeletal:        General: Normal range of motion.     Cervical back: Neck supple.     Comments: Has tremor  Lymphadenopathy:     Cervical: No cervical adenopathy.  Skin:    General: Skin is warm and dry.  Neurological:     Mental Status: He is alert. Mental status is at baseline.  Psychiatric:        Mood and Affect: Mood normal.      ASSESSMENT/ PLAN:  TODAY  Hypertension associated with type 2 diabetes mellitus: b/p 118/77: will continue norvasc  2.5 mg daily   2. Diabetes mellitus with peripheral vascular disease: hgb A1c 7.5; will continue to monitor his status.   3. Hyperlipidemia associated with type 2 diabetes mellitus: ldl 40; will continue  crestor  10 mg daily   4. CKD stage 3 secondary diabetes mellitus: bun 16; creat 1.24 gfr 53  5. Increased intraocular pressure bilateral: will continue cosopt  to both eyes twice daily; xatalan to both eyes  nightly   6. Moderate protein calorie malnutrition: albumin 2.9 will continue supplements as directed.   7. Chronic non-seasonal allergic rhinitis: will continue claritin 10 mg daily; azelastine  bid.   8. Chronic constipation: will continue senna s nightly     Barnie Seip NP [facility]   call [phone]

## 2024-08-08 ENCOUNTER — Ambulatory Visit: Admitting: Podiatry

## 2024-08-10 ENCOUNTER — Other Ambulatory Visit: Payer: Self-pay

## 2024-08-10 ENCOUNTER — Encounter (HOSPITAL_COMMUNITY): Payer: Self-pay

## 2024-08-10 ENCOUNTER — Emergency Department (HOSPITAL_COMMUNITY)
Admission: EM | Admit: 2024-08-10 | Discharge: 2024-08-11 | Disposition: A | Discharge status: Home / Self Care | Attending: Emergency Medicine | Admitting: Emergency Medicine

## 2024-08-10 DIAGNOSIS — M47812 Spondylosis without myelopathy or radiculopathy, cervical region: Secondary | ICD-10-CM | POA: Diagnosis not present

## 2024-08-10 DIAGNOSIS — W06XXXA Fall from bed, initial encounter: Secondary | ICD-10-CM | POA: Insufficient documentation

## 2024-08-10 DIAGNOSIS — R918 Other nonspecific abnormal finding of lung field: Secondary | ICD-10-CM | POA: Diagnosis not present

## 2024-08-10 DIAGNOSIS — R220 Localized swelling, mass and lump, head: Secondary | ICD-10-CM | POA: Diagnosis not present

## 2024-08-10 DIAGNOSIS — S0993XA Unspecified injury of face, initial encounter: Secondary | ICD-10-CM | POA: Diagnosis not present

## 2024-08-10 DIAGNOSIS — I5031 Acute diastolic (congestive) heart failure: Secondary | ICD-10-CM | POA: Insufficient documentation

## 2024-08-10 DIAGNOSIS — S01112A Laceration without foreign body of left eyelid and periocular area, initial encounter: Secondary | ICD-10-CM | POA: Diagnosis not present

## 2024-08-10 DIAGNOSIS — I130 Hypertensive heart and chronic kidney disease with heart failure and stage 1 through stage 4 chronic kidney disease, or unspecified chronic kidney disease: Secondary | ICD-10-CM | POA: Insufficient documentation

## 2024-08-10 DIAGNOSIS — I2510 Atherosclerotic heart disease of native coronary artery without angina pectoris: Secondary | ICD-10-CM | POA: Insufficient documentation

## 2024-08-10 DIAGNOSIS — S01111A Laceration without foreign body of right eyelid and periocular area, initial encounter: Secondary | ICD-10-CM | POA: Diagnosis not present

## 2024-08-10 DIAGNOSIS — W19XXXA Unspecified fall, initial encounter: Secondary | ICD-10-CM

## 2024-08-10 DIAGNOSIS — S0181XA Laceration without foreign body of other part of head, initial encounter: Secondary | ICD-10-CM | POA: Diagnosis not present

## 2024-08-10 DIAGNOSIS — S0083XA Contusion of other part of head, initial encounter: Secondary | ICD-10-CM

## 2024-08-10 DIAGNOSIS — N183 Chronic kidney disease, stage 3 unspecified: Secondary | ICD-10-CM | POA: Diagnosis not present

## 2024-08-10 DIAGNOSIS — M4802 Spinal stenosis, cervical region: Secondary | ICD-10-CM | POA: Diagnosis not present

## 2024-08-10 DIAGNOSIS — Z043 Encounter for examination and observation following other accident: Secondary | ICD-10-CM | POA: Diagnosis not present

## 2024-08-10 NOTE — ED Triage Notes (Signed)
 Pt via [facility] d/t fall tonight out of bed, possible hitting left side of eyebrow/eyelid. Laceration- bleeding controlled. No thinners. Hx of dementia.

## 2024-08-11 ENCOUNTER — Emergency Department (HOSPITAL_COMMUNITY)

## 2024-08-11 DIAGNOSIS — M47812 Spondylosis without myelopathy or radiculopathy, cervical region: Secondary | ICD-10-CM | POA: Diagnosis not present

## 2024-08-11 DIAGNOSIS — R220 Localized swelling, mass and lump, head: Secondary | ICD-10-CM | POA: Diagnosis not present

## 2024-08-11 DIAGNOSIS — R918 Other nonspecific abnormal finding of lung field: Secondary | ICD-10-CM | POA: Diagnosis not present

## 2024-08-11 DIAGNOSIS — Z043 Encounter for examination and observation following other accident: Secondary | ICD-10-CM | POA: Diagnosis not present

## 2024-08-11 DIAGNOSIS — M4802 Spinal stenosis, cervical region: Secondary | ICD-10-CM | POA: Diagnosis not present

## 2024-08-11 NOTE — ED Provider Notes (Signed)
 AP-EMERGENCY DEPT [facility] Emergency Department Provider Note MRN:  [id number]  Arrival date & time: 08/11/24     Chief Complaint   Fall   History of Present Illness   Roy Ibarra is a 89 y.o. year-old male with a history of CAD, CKD, hypertension presenting to the ED with chief complaint of fall.  Clemens out of bed tonight, laceration to the right eyebrow.  Denies any other complaints.  Review of Systems  A thorough review of systems was obtained and all systems are negative except as noted in the HPI and PMH.   Patient's Health History    Past Medical History:  Diagnosis Date   Acute diastolic CHF (congestive heart failure) (HCC) 12/07/2021   CAD (coronary artery disease)    CKD (chronic kidney disease), stage III (HCC)    Hyperlipidemia    Hypertension    Ischemic cardiomyopathy     Past Surgical History:  Procedure Laterality Date   INGUINAL HERNIA REPAIR     PROSTATE BIOPSY     STENT PLACEMENT VASCULAR ([facility] HX)  11/2017    Family History  Problem Relation Age of Onset   Diabetes Brother    Diabetes Brother    Diabetes Brother     Social History   Socioeconomic History   Marital status: Widowed    Spouse name: Not on file   Number of children: Not on file   Years of education: Not on file   Highest education level: Not on file  Occupational History   Not on file  Tobacco Use   Smoking status: Never   Smokeless tobacco: Never  Vaping Use   Vaping status: Never Used  Substance and Sexual Activity   Alcohol use: Not Currently   Drug use: Never   Sexual activity: Not Currently  Other Topics Concern   Not on file  Social History Narrative   Not on file   Social Drivers of Health   Financial Resource Strain: Not on file  Food Insecurity: Not on file  Transportation Needs: Not on file  Physical Activity: Not on file  Stress: Not on file  Social Connections: Not on file  Intimate Partner Violence: Not on file     Physical Exam    Vitals:   08/11/24 0008 08/11/24 0015  BP:  139/87  Pulse: 99 (!) 105  Resp:  19  Temp:    SpO2: 98% 94%    CONSTITUTIONAL: Chronically ill-appearing, NAD NEURO/PSYCH:  Alert and oriented x 3, no focal deficits EYES:  eyes equal and reactive ENT/NECK:  no LAD, no JVD CARDIO: Regular rate, well-perfused, normal S1 and S2 PULM:  CTAB no wheezing or rhonchi GI/GU:  non-distended, non-tender MSK/SPINE:  No gross deformities, no edema SKIN:  no rash, laceration to left brow   *Additional and/or pertinent findings included in MDM below  Diagnostic and Interventional Summary    EKG Interpretation Date/Time:    Ventricular Rate:    PR Interval:    QRS Duration:    QT Interval:    QTC Calculation:   R Axis:      Text Interpretation:         Labs Reviewed - No data to display  CT HEAD WO CONTRAST ( )  Final Result    CT CERVICAL SPINE WO CONTRAST  Final Result      Medications - No data to display   Procedures  /  Critical Care .Laceration Repair  Date/Time: 08/11/2024 12:44 AM  Performed by: Theadore Sharper  M, MD Authorized by: Theadore Ozell HERO, MD   Consent:    Consent obtained:  Verbal   Consent given by:  Patient   Risks, benefits, and alternatives were discussed: yes   Universal protocol:    Procedure explained and questions answered to patient or proxy's satisfaction: yes     Immediately prior to procedure, a time out was called: yes     Patient identity confirmed:  Verbally with patient Anesthesia:    Anesthesia method:  None Laceration details:    Location:  Face   Face location:  L eyebrow   Length (cm):  3   Depth (mm):  2 Exploration:    Imaging outcome: foreign body not noted     Wound exploration: wound explored through full range of motion and entire depth of wound visualized     Contaminated: no   Treatment:    Area cleansed with:  Soap and water   Amount of cleaning:  Standard Skin repair:    Repair method:  Tissue adhesive and  Steri-Strips   Number of Steri-Strips:  3 Approximation:    Approximation:  Close Repair type:    Repair type:  Simple Post-procedure details:    Dressing:  Open (no dressing)   Procedure completion:  Tolerated well, no immediate complications   ED Course and Medical Decision Making  Initial Impression and Ddx Fall out of bed mechanical fall no evidence of head trauma, advanced age, obtaining CT head and cervical spine to exclude significant traumatic injury.  Patient has no chest or abdominal tenderness, no midline spinal tenderness, normal range of motion of the arms and legs, I see no other significant trauma.  Patient is not anticoagulated.  Reassuring vital signs.  Past medical/surgical history that increases complexity of ED encounter: Advanced age  Interpretation of Diagnostics I personally reviewed the CT head and cervical spine and my interpretation is as follows: No significant injuries    Patient Reassessment and Ultimate Disposition/Management     Laceration repaired as described above.  I updated patient's daughter and she confirms the history obtained of mechanical fall.  Appropriate for discharge.  Patient management required discussion with the following services or consulting groups:  None  Complexity of Problems Addressed Acute illness or injury that poses threat of life of bodily function  Additional Data Reviewed and Analyzed Further history obtained from: Further history from spouse/family member  Additional Factors Impacting ED Encounter Risk Consideration of hospitalization  Ozell HERO. Theadore, MD [facility] Emergency [facility] [email or web address]  Final Clinical Impressions(s) / ED Diagnoses     ICD-10-CM   1. Fall, initial encounter  W19.XXXA     2. Contusion of face, initial encounter  S00.83XA     3. Laceration of brow without complication, initial encounter  S01.81XA       ED Discharge Orders     None         Discharge Instructions Discussed with and Provided to Patient:     Discharge Instructions      You were evaluated in the Emergency Department and after careful evaluation, we did not find any emergent condition requiring admission or further testing in the hospital.  Your exam/testing today is overall reassuring.  CT scans did not show any significant injuries.  The medical adhesive and Steri-Strips will wear away and fall off with time.  Try to keep it dry over the next few days.  Please return to the Emergency Department if you experience any  worsening of your condition.   Thank you for allowing us  to be a part of your care.       Theadore Ozell HERO, MD 08/11/24 [phone]

## 2024-08-11 NOTE — Discharge Instructions (Signed)
 You were evaluated in the Emergency Department and after careful evaluation, we did not find any emergent condition requiring admission or further testing in the hospital.  Your exam/testing today is overall reassuring.  CT scans did not show any significant injuries.  The medical adhesive and Steri-Strips will wear away and fall off with time.  Try to keep it dry over the next few days.  Please return to the Emergency Department if you experience any worsening of your condition.   Thank you for allowing us  to be a part of your care.

## 2024-08-22 ENCOUNTER — Ambulatory Visit: Admitting: Podiatry

## 2024-08-23 ENCOUNTER — Non-Acute Institutional Stay (SKILLED_NURSING_FACILITY): Payer: Self-pay | Admitting: Adult Health

## 2024-08-23 ENCOUNTER — Encounter: Payer: Self-pay | Admitting: Adult Health

## 2024-08-23 DIAGNOSIS — F03918 Unspecified dementia, unspecified severity, with other behavioral disturbance: Secondary | ICD-10-CM

## 2024-08-23 NOTE — Progress Notes (Signed)
 Location:  [facility] Room Number: [id number] Place of Service:  SNF (31)   CODE STATUS: DNR  Allergies  Allergen Reactions   Ace Inhibitors Swelling    Chief Complaint  Patient presents with   Behavioral Disturbance    HPI:  He has been having behavioral disturbances that involve violent behaviors; and aggressive behaviors. These including hitting kicking. He does have ativan 0.5 mg every 6 hours as needed. His family is in agreement take seroquel 12.5 mg in the afternoon to help with these behaviors. The behaviors are worse in the evening time.   Past Medical History:  Diagnosis Date   Acute diastolic CHF (congestive heart failure) (HCC) 12/07/2021   CAD (coronary artery disease)    CKD (chronic kidney disease), stage III (HCC)    Hyperlipidemia    Hypertension    Ischemic cardiomyopathy     Past Surgical History:  Procedure Laterality Date   INGUINAL HERNIA REPAIR     PROSTATE BIOPSY     STENT PLACEMENT VASCULAR ([facility] HX)  11/2017    Social History   Socioeconomic History   Marital status: Widowed    Spouse name: Not on file   Number of children: Not on file   Years of education: Not on file   Highest education level: Not on file  Occupational History   Not on file  Tobacco Use   Smoking status: Never   Smokeless tobacco: Never  Vaping Use   Vaping status: Never Used  Substance and Sexual Activity   Alcohol use: Not Currently   Drug use: Never   Sexual activity: Not Currently  Other Topics Concern   Not on file  Social History Narrative   Not on file   Social Drivers of Health   Financial Resource Strain: Not on file  Food Insecurity: Not on file  Transportation Needs: Not on file  Physical Activity: Not on file  Stress: Not on file  Social Connections: Not on file  Intimate Partner Violence: Not on file   Family History  Problem Relation Age of Onset   Diabetes Brother    Diabetes Brother    Diabetes Brother        VITAL SIGNS BP (!) 130/90   Pulse 100   Temp (!) 97.3 F (36.3 C)   Resp 20   Ht 5' 10 (1.778 m)   Wt 159 lb 6.4 oz (72.3 kg)   SpO2 100%   BMI 22.87 kg/m   Outpatient Encounter Medications as of 08/23/2024  Medication Sig   acetaminophen  (TYLENOL ) 500 MG tablet Take 1 tablet (500 mg total) by mouth every 6 (six) hours as needed.   amLODipine  (NORVASC ) 2.5 MG tablet Take 1 tablet (2.5 mg total) by mouth daily.   aspirin  81 MG chewable tablet Chew 81 mg by mouth daily.   azelastine  (ASTELIN ) 0.1 % nasal spray Place 1 spray into both nostrils 2 (two) times daily.   Continuous Glucose Receiver (FREESTYLE LIBRE 2 READER) DEVI by Does not apply route.   Continuous Glucose Sensor (FREESTYLE LIBRE 2 SENSOR) MISC by Does not apply route.  place sensor on back of arm and change q 2 weeks   dorzolamide -timolol  (COSOPT ) 22.3-6.8 MG/ML ophthalmic solution Place 1 drop into both eyes 2 (two) times daily.   latanoprost  (XALATAN ) 0.005 % ophthalmic solution Place 1 drop into both eyes at bedtime.   loratadine (CLARITIN) 10 MG tablet Take 10 mg by mouth daily.   LORazepam (ATIVAN) 0.5 MG tablet  Take 0.5 mg by mouth every 6 (six) hours as needed for anxiety. Special Instructions: hitting, kicking, unable to redirect; dx anxiety may pull from emergency kit until medication arrives   rosuvastatin  (CRESTOR ) 10 MG tablet Take 1 tablet (10 mg total) by mouth daily.   sennosides-docusate sodium (SENOKOT-S) 8.6-50 MG tablet Take 1 tablet by mouth at bedtime.   benzonatate  (TESSALON  PERLES) 100 MG capsule Take 1 capsule (100 mg total) by mouth 3 (three) times daily as needed for cough. (Patient not taking: Reported on 08/23/2024)   blood glucose meter kit and supplies Dispense based on patient and insurance preference. Use up to four times daily as directed. (FOR ICD-10 E10.9, E11.9). (Patient not taking: Reported on 08/23/2024)   Casanthranol-Docusate Sodium (STOOL SOFTENER PLUS PO) Take by mouth.  (Patient not taking: Reported on 08/23/2024)   desloratadine  (CLARINEX ) 5 MG tablet Take 1 tablet (5 mg total) by mouth daily. For allergies (Patient not taking: Reported on 08/23/2024)   glucose blood test strip Check BGs daily. E11.9 (Patient not taking: Reported on 08/23/2024)   Lancet Device MISC UAD to check BGs daily E11.9 (Patient not taking: Reported on 08/23/2024)   No facility-administered encounter medications on file as of 08/23/2024.     SIGNIFICANT DIAGNOSTIC EXAMS  LABS  06-20-24: wbc 7.9; hgb 11.7; hct 34.2; mcv 85.5 plt 251; glucose 167; bun 16; creat 1.24; k+ 4.2; na++ 131; ca 8.8; gfr 53; protein 7.8 albumin 2.9; hgb A1c 7.5; tsh 1.809; chol 79; ldl 40; trig 31; hdl 33  Review of Systems  Reason unable to perform ROS: unable to fully participate.    Physical Exam Constitutional:      General: He is not in acute distress.    Appearance: He is well-developed. He is not diaphoretic.  Neck:     Thyroid : No thyromegaly.  Cardiovascular:     Rate and Rhythm: Normal rate and regular rhythm.     Heart sounds: Normal heart sounds.  Pulmonary:     Effort: Pulmonary effort is normal. No respiratory distress.     Breath sounds: Normal breath sounds.  Abdominal:     General: Bowel sounds are normal. There is no distension.     Palpations: Abdomen is soft.     Tenderness: There is no abdominal tenderness.  Musculoskeletal:        General: Normal range of motion.     Cervical back: Neck supple.     Right lower leg: No edema.     Left lower leg: No edema.  Lymphadenopathy:     Cervical: No cervical adenopathy.  Skin:    General: Skin is warm and dry.  Neurological:     Mental Status: He is alert. Mental status is at baseline.     Comments: Has tremor  Psychiatric:        Mood and Affect: Mood normal.       ASSESSMENT/ PLAN:  TODAY  Dementia with behavioral disturbance: will begin seroquel 12.5 mg in the afternoon and will monitor his status    Roy Seip NP [facility]  call [phone]

## 2024-09-05 DEATH — deceased

## 2024-12-05 ENCOUNTER — Encounter: Payer: 59 | Admitting: Family Medicine
